# Patient Record
Sex: Female | Born: 1940 | Hispanic: Refuse to answer | Marital: Married | Smoking: Never smoker
Health system: Southern US, Community
[De-identification: ages and names within clinical notes are randomized; demographics above are authoritative.]

## PROBLEM LIST (undated history)

## (undated) ENCOUNTER — Emergency Department (HOSPITAL_COMMUNITY): Payer: Medicare Other

## (undated) DIAGNOSIS — I82409 Acute embolism and thrombosis of unspecified deep veins of unspecified lower extremity: Secondary | ICD-10-CM

## (undated) DIAGNOSIS — I8390 Asymptomatic varicose veins of unspecified lower extremity: Secondary | ICD-10-CM

## (undated) HISTORY — DX: Acute embolism and thrombosis of unspecified deep veins of unspecified lower extremity: I82.409

## (undated) HISTORY — DX: Asymptomatic varicose veins of unspecified lower extremity: I83.90

## (undated) HISTORY — PX: VEIN LIGATION AND STRIPPING: SHX2653

---

## 1970-04-17 HISTORY — PX: TUBAL LIGATION: SHX77

## 2008-07-29 ENCOUNTER — Ambulatory Visit: Payer: Self-pay | Admitting: Vascular Surgery

## 2010-08-30 NOTE — Procedures (Signed)
LOWER EXTREMITY VENOUS REFLUX EXAM   INDICATION:  Bilateral lower extremity varicose veins.  Patient had a  history of right greater saphenous vein stripping in 1980.   EXAM:  Using color-flow imaging and pulse Doppler spectral analysis, the  right and left common femoral, superficial femoral, popliteal, posterior  tibial, greater and lesser saphenous veins are evaluated.  There is  evidence suggesting deep venous insufficiency in the common femoral  veins only bilaterally.   The anterior branch of the left greater saphenous vein is not competent.  The left greater saphenous vein anterior branch is not competent with  the caliber as described below.   The right and left proximal short saphenous veins demonstrate  competency.   GSV Diameter (used if found to be incompetent only)                                            Right    Left  Proximal Greater Saphenous Vein           cm       1.3 cm  Proximal-to-mid-thigh                     cm       0.7 cm  Mid thigh                                 cm       0.7 cm  Mid-distal thigh                          cm       0.8 cm  Distal thigh                              cm       0.6 cm  Knee                                      cm       0.6 cm   IMPRESSION:  1. The left anterior branch of the greater saphenous vein reflux is      identified with the caliber ranging from 1.3 cm to 06 cm knee to      groin.  2. The right and left greater saphenous veins are not aneurysmal.  3. The left greater saphenous vein is tortuous.  4. The deep venous system is mildly incompetent in the common femoral      veins only bilaterally.  5. The right and left lesser saphenous veins are competent.  6. On the right, there appears to be a Baker's cyst behind the right      knee.  There is a superficial vein which originates from an      incompetent right superficial femoral vein perforator at the      proximal thigh level.  An additional  perforator communicates with      this vein at the posterior tibial vein at the ankle.  There is a      large posterior varicose vein which courses up the back of the leg      and  originates from an unknown vein.  7. On the left, the anterior branch of the greater saphenous vein is      incompetent and tortuous from the mid thigh distally.  At the      distal thigh, the anterior branch crosses the leg and continues      laterally down the remainder of the leg.  The medial branch of the      left greater saphenous vein is competent.   ___________________________________________  Larina Earthly, M.D.   MC/MEDQ  D:  07/29/2008  T:  07/29/2008  Job:  [id number]

## 2010-08-30 NOTE — Consult Note (Signed)
NEW PATIENT CONSULTATION   Virginia Roberts, Virginia Roberts  DOB:  [birth year]                                       07/29/2008  [id number]#:[id number]   The patient presents today for evaluation of lower extremity venous  pathology.  She is a very pleasant 70 year old lady who had a long  history of venous pathology.  She had a prior ligation/stripping of her  right great saphenous vein in 1980.  She has had progressive changes in  her left leg of venous pathology.  She denies any bleeding from this.  She does have aching discomfort over the varicosities in her left leg.  She has had a good result from her right leg vein stripping.   PAST MEDICAL HISTORY:  Significant for hypertension.   FAMILY HISTORY:  Significant for severe varicosities in her mother  leading to eventual venous ulcers.   SOCIAL HISTORY:  She is married with two children.  She works as a  Futures trader.  She does not smoke or drink  alcohol.   REVIEW OF SYSTEMS:  CONSTITUTIONAL:  Her weight is reported at 152  pounds.  She is 5 feet 2 inches tall.  GI:  Significant for irritable colon, sometimes diverticulitis.  Did  have a history of thrombophlebitis during delivery.   PHYSICAL EXAM:  Blood pressure is 160/94, heart rate 76.  Her radial  pulses are 2+.  She has 2+ dorsalis pedis pulses bilaterally.  Her lower  extremities are noted for well-healed incisions from her old vein  stripping on the right leg.  She does have a few scattered recurrent  varices over her calf.  On the left leg she has marked varicose veins  extending from her groin over her anterior thigh lateral knee and  lateral calf.  She reports that she does have pain and fullness over  these varicosities.  She does not have any history of recent  thrombophlebitis since childbirth 30 years ago.   She underwent noninvasive vascular laboratory studies in our office and  this reveals a left anterior branch of her saphenous  vein giving rise to  the large varicosities extending over her thigh and distally.  I  discussed treatment options with the patient.  I explained that this is  not a life or limb threatening problem.  I explained that the  indications for treatment of this would be progressive pain.  She  reports that she does have discomfort related to this but currently this  is not disabling to her and she is comfortable with discussion that it  is safe to watch this.  I explained that it is possible that she could  progress to severe venous hypertension and ulceration like her mother  had but this is unlikely given her current level of pathology.  She does  not have any significant skin changes currently.  She was pleased with  this discussion and will see [place] again on an as-needed basis.   Larina Earthly, M.D.  Electronically Signed   TFE/MEDQ  D:  07/29/2008  T:  07/30/2008  Job:  [id number]   cc:   Fredderick Severance

## 2013-11-06 ENCOUNTER — Other Ambulatory Visit: Payer: Self-pay | Admitting: *Deleted

## 2013-11-06 DIAGNOSIS — I83893 Varicose veins of bilateral lower extremities with other complications: Secondary | ICD-10-CM

## 2014-02-10 ENCOUNTER — Encounter: Payer: Self-pay | Admitting: Vascular Surgery

## 2014-02-11 ENCOUNTER — Encounter: Payer: Self-pay | Admitting: Vascular Surgery

## 2014-02-11 ENCOUNTER — Ambulatory Visit (HOSPITAL_COMMUNITY)
Admission: RE | Admit: 2014-02-11 | Discharge: 2014-02-11 | Disposition: A | Discharge status: Home / Self Care | Payer: Medicare Other | Source: Ambulatory Visit | Attending: Vascular Surgery | Admitting: Vascular Surgery

## 2014-02-11 ENCOUNTER — Ambulatory Visit (INDEPENDENT_AMBULATORY_CARE_PROVIDER_SITE_OTHER): Payer: Medicare Other | Admitting: Vascular Surgery

## 2014-02-11 VITALS — BP 143/70 | HR 69 | Resp 16 | Ht 62.75 in | Wt 136.1 lb

## 2014-02-11 DIAGNOSIS — I83899 Varicose veins of unspecified lower extremities with other complications: Secondary | ICD-10-CM | POA: Insufficient documentation

## 2014-02-11 DIAGNOSIS — I83893 Varicose veins of bilateral lower extremities with other complications: Secondary | ICD-10-CM

## 2014-02-11 DIAGNOSIS — I8393 Asymptomatic varicose veins of bilateral lower extremities: Secondary | ICD-10-CM | POA: Diagnosis present

## 2014-02-11 NOTE — Assessment & Plan Note (Signed)
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 There are significant varicosities along the medial aspect of her right thigh and also the anterior lateral aspect of her left thigh and lateral aspect of her left leg. She does have significant deep vein reflux but no saphenous vein reflux on the left and her right greater saphenous vein has previously been stripped. Therefore, I think the only way to address her large varicosities would be surgical excision. She is not a candidate for laser ablation of the left greater saphenous vein since this is competent. We discussed the importance of intermittent leg elevation in the proper positioning for this. I have encouraged her to avoid prolonged sitting and standing. I have encouraged her to stay as active as possible. I offered to write her a prescription for compression stockings how her she states that she already has some. We will see her back as needed. Certainly if her symptoms progressed her she had any complications related to her varicose veins she could be considered for surgical excision.

## 2014-02-11 NOTE — Progress Notes (Signed)
Patient ID: Virginia Roberts, female   DOB: [birth year], 73 y.o.   MRN: [id number]  Reason for Consult: New Evaluation and Varicose Veins   Referred by No ref. provider found  Subjective:     HPI:  Virginia PolesFannie Schwenke is a 73 y.o. female With a long history of varicose veins. She had a DVT in the late 70s after pregnancy in the right lower extremity. She denies any subsequent history of DVT. More recently, in July of this year, she had some phlebitis along the lateral aspect of her left leg. She has significant varicose veins bilaterally and notes aching and heaviness in both lower extremities. Her symptoms are aggravated by standing and sitting. Her symptoms are relieved somewhat with elevation. Her symptoms are somewhat more significant on the left side. She also notes some swelling in both lower extremities which is aggravated by standing. She still works and spends a fair amount of time sitting and standing.  Past Medical History  Diagnosis Date  . DVT (deep venous thrombosis)   . Varicose veins    Family History  Problem Relation Age of Onset  . Cancer Brother   . Hyperlipidemia Brother   . Hypertension Brother   . Heart disease Brother   . Varicose Veins Brother   . Diabetes Daughter   . Hyperlipidemia Daughter   . Diabetes Son   . Hypertension Son   . Hyperlipidemia Son    Past Surgical History  Procedure Laterality Date  . Tubal ligation  1972  . Vein ligation and stripping Right late 1970s    Short Social History:  History  Substance Use Topics  . Smoking status: Never Smoker   . Smokeless tobacco: Not on file  . Alcohol Use: No    Allergies  Allergen Reactions  . Cantil [Mepenzolate]     hives  . Erythromycin     Stomach cramps    Current Outpatient Prescriptions  Medication Sig Dispense Refill  . alendronate (FOSAMAX) 70 MG tablet Take 70 mg by mouth once a week. Take with a full glass of water on an empty stomach.      . ALPRAZolam (XANAX) 1 MG tablet Take  1 mg by mouth at bedtime.      Marland Kitchen. amLODipine (NORVASC) 5 MG tablet Take 5 mg by mouth daily.      Marland Kitchen. oxybutynin (DITROPAN-XL) 10 MG 24 hr tablet Take 10 mg by mouth at bedtime.      . triamterene-hydrochlorothiazide (MAXZIDE) 75-50 MG per tablet Take 1 tablet by mouth daily. Takes 1/2 tablet daily.       No current facility-administered medications for this visit.    Review of Systems  Constitutional: Negative for chills and fever.  Eyes: Negative for loss of vision.  Respiratory: Negative for cough and wheezing.  Cardiovascular: Positive for leg swelling. Negative for chest pain, chest tightness, claudication, dyspnea with exertion, orthopnea and palpitations.  GI: Negative for blood in stool and vomiting.  GU: Negative for dysuria and hematuria.  Musculoskeletal: Negative for leg pain, joint pain and myalgias.  Skin: Negative for rash and wound.  Neurological: Negative for dizziness and speech difficulty.  Hematologic: Negative for bruises/bleeds easily. Psychiatric: Negative for depressed mood.        Objective:  Objective  Filed Vitals:   02/11/14 1254  BP: 143/70  Pulse: 69  Resp: 16  Height: 5' 2.75" (1.594 m)  Weight: 136 lb 1.6 oz (61.735 kg)   Body mass index is 24.3 kg/(m^2).  Physical  Exam  Constitutional: She is oriented to person, place, and time. She appears well-developed and well-nourished.  HENT:  Head: Normocephalic and atraumatic.  Neck: Neck supple. No JVD present. No thyromegaly present.  Cardiovascular: Normal rate, regular rhythm and normal heart sounds.  Exam reveals no friction rub.   No murmur heard. Pulses:      Femoral pulses are 2+ on the right side, and 2+ on the left side.      Posterior tibial pulses are 2+ on the right side, and 2+ on the left side.  I do not detect carotid bruits.  Pulmonary/Chest: Breath sounds normal. She has no wheezes. She has no rales.  Abdominal: Soft. Bowel sounds are normal. There is no tenderness.    Musculoskeletal: Normal range of motion. She exhibits no edema.  Lymphadenopathy:    She has no cervical adenopathy.  Neurological: She is alert and oriented to person, place, and time. She has normal strength. No sensory deficit.  Skin: No lesion and no rash noted.  Psychiatric: She has a normal mood and affect.   Data: I have independently interpreted her venous duplex scan. There is no evidence of DVT bilaterally. She does have reflux in the  Deep veins involving the common femoral vein bilaterally. The right greater saphenous vein has been stripped. The left greater saphenous vein does not have significant reflux although there is some reflux at the saphenofemoral junction which feeds a large tortuous anterior saphenous branch which extends along the anterior lateral aspect of the left thigh and lateral aspect of the left leg.      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 There are significant varicosities along the medial aspect of her right thigh and also the anterior lateral aspect of her left thigh and lateral aspect of her left leg. She does have significant deep vein reflux but no saphenous vein reflux on the left and her right greater saphenous vein has previously been stripped. Therefore, I think the only way to address her large varicosities would be surgical excision. She is not a candidate for laser ablation of the left greater saphenous vein since this is competent. We discussed the importance of intermittent leg elevation in the proper positioning for this. I have encouraged her to avoid prolonged sitting and standing. I have encouraged her to stay as active as possible. I offered to write her a prescription for compression stockings how her she states that she already has some. We will see her back as needed. Certainly if her symptoms progressed her she had any complications related  to her varicose veins she could be considered for surgical excision.    Chuck Hinthristopher S Dickson MD [facility]

## 2014-05-20 ENCOUNTER — Encounter: Payer: Self-pay | Admitting: Vascular Surgery

## 2014-05-20 ENCOUNTER — Encounter (HOSPITAL_COMMUNITY): Payer: Self-pay

## 2021-04-01 ENCOUNTER — Inpatient Hospital Stay (HOSPITAL_COMMUNITY): Payer: Medicare Other

## 2021-04-01 ENCOUNTER — Other Ambulatory Visit: Payer: Self-pay

## 2021-04-01 ENCOUNTER — Inpatient Hospital Stay (HOSPITAL_COMMUNITY)
Admission: EM | Admit: 2021-04-01 | Discharge: 2021-04-08 | DRG: 246 | Disposition: A | Discharge status: Home / Self Care | Payer: Medicare Other | Attending: Family Medicine | Admitting: Family Medicine

## 2021-04-01 ENCOUNTER — Encounter (HOSPITAL_COMMUNITY): Payer: Self-pay | Admitting: Cardiovascular Disease

## 2021-04-01 ENCOUNTER — Inpatient Hospital Stay (HOSPITAL_COMMUNITY)
Admission: EM | Disposition: A | Discharge status: Home / Self Care | Payer: Self-pay | Source: Home / Self Care | Attending: Cardiovascular Disease

## 2021-04-01 ENCOUNTER — Other Ambulatory Visit (HOSPITAL_COMMUNITY): Payer: Self-pay

## 2021-04-01 DIAGNOSIS — E042 Nontoxic multinodular goiter: Secondary | ICD-10-CM | POA: Diagnosis present

## 2021-04-01 DIAGNOSIS — I213 ST elevation (STEMI) myocardial infarction of unspecified site: Secondary | ICD-10-CM | POA: Diagnosis present

## 2021-04-01 DIAGNOSIS — E785 Hyperlipidemia, unspecified: Secondary | ICD-10-CM | POA: Diagnosis present

## 2021-04-01 DIAGNOSIS — N390 Urinary tract infection, site not specified: Secondary | ICD-10-CM | POA: Diagnosis not present

## 2021-04-01 DIAGNOSIS — Z809 Family history of malignant neoplasm, unspecified: Secondary | ICD-10-CM | POA: Diagnosis not present

## 2021-04-01 DIAGNOSIS — B9620 Unspecified Escherichia coli [E. coli] as the cause of diseases classified elsewhere: Secondary | ICD-10-CM | POA: Diagnosis present

## 2021-04-01 DIAGNOSIS — Z83438 Family history of other disorder of lipoprotein metabolism and other lipidemia: Secondary | ICD-10-CM | POA: Diagnosis not present

## 2021-04-01 DIAGNOSIS — I2121 ST elevation (STEMI) myocardial infarction involving left circumflex coronary artery: Secondary | ICD-10-CM

## 2021-04-01 DIAGNOSIS — J810 Acute pulmonary edema: Secondary | ICD-10-CM

## 2021-04-01 DIAGNOSIS — I340 Nonrheumatic mitral (valve) insufficiency: Secondary | ICD-10-CM | POA: Diagnosis not present

## 2021-04-01 DIAGNOSIS — I2119 ST elevation (STEMI) myocardial infarction involving other coronary artery of inferior wall: Principal | ICD-10-CM

## 2021-04-01 DIAGNOSIS — Z86718 Personal history of other venous thrombosis and embolism: Secondary | ICD-10-CM

## 2021-04-01 DIAGNOSIS — I5041 Acute combined systolic (congestive) and diastolic (congestive) heart failure: Secondary | ICD-10-CM | POA: Diagnosis not present

## 2021-04-01 DIAGNOSIS — Z881 Allergy status to other antibiotic agents status: Secondary | ICD-10-CM

## 2021-04-01 DIAGNOSIS — Z781 Physical restraint status: Secondary | ICD-10-CM

## 2021-04-01 DIAGNOSIS — R443 Hallucinations, unspecified: Secondary | ICD-10-CM | POA: Diagnosis not present

## 2021-04-01 DIAGNOSIS — Z20822 Contact with and (suspected) exposure to covid-19: Secondary | ICD-10-CM | POA: Diagnosis present

## 2021-04-01 DIAGNOSIS — Z833 Family history of diabetes mellitus: Secondary | ICD-10-CM

## 2021-04-01 DIAGNOSIS — R079 Chest pain, unspecified: Secondary | ICD-10-CM | POA: Diagnosis present

## 2021-04-01 DIAGNOSIS — I25110 Atherosclerotic heart disease of native coronary artery with unstable angina pectoris: Secondary | ICD-10-CM | POA: Diagnosis present

## 2021-04-01 DIAGNOSIS — Z888 Allergy status to other drugs, medicaments and biological substances status: Secondary | ICD-10-CM

## 2021-04-01 DIAGNOSIS — Z8249 Family history of ischemic heart disease and other diseases of the circulatory system: Secondary | ICD-10-CM

## 2021-04-01 DIAGNOSIS — I700 Atherosclerosis of aorta: Secondary | ICD-10-CM | POA: Diagnosis present

## 2021-04-01 DIAGNOSIS — G9340 Encephalopathy, unspecified: Secondary | ICD-10-CM | POA: Diagnosis not present

## 2021-04-01 DIAGNOSIS — I110 Hypertensive heart disease with heart failure: Secondary | ICD-10-CM | POA: Diagnosis present

## 2021-04-01 DIAGNOSIS — F05 Delirium due to known physiological condition: Secondary | ICD-10-CM | POA: Diagnosis present

## 2021-04-01 DIAGNOSIS — Z7982 Long term (current) use of aspirin: Secondary | ICD-10-CM | POA: Diagnosis not present

## 2021-04-01 DIAGNOSIS — Z955 Presence of coronary angioplasty implant and graft: Secondary | ICD-10-CM

## 2021-04-01 DIAGNOSIS — I200 Unstable angina: Secondary | ICD-10-CM | POA: Diagnosis not present

## 2021-04-01 DIAGNOSIS — I5021 Acute systolic (congestive) heart failure: Secondary | ICD-10-CM | POA: Diagnosis present

## 2021-04-01 DIAGNOSIS — Z79899 Other long term (current) drug therapy: Secondary | ICD-10-CM | POA: Diagnosis not present

## 2021-04-01 DIAGNOSIS — I8390 Asymptomatic varicose veins of unspecified lower extremity: Secondary | ICD-10-CM | POA: Diagnosis present

## 2021-04-01 DIAGNOSIS — I083 Combined rheumatic disorders of mitral, aortic and tricuspid valves: Secondary | ICD-10-CM | POA: Diagnosis present

## 2021-04-01 DIAGNOSIS — I5043 Acute on chronic combined systolic (congestive) and diastolic (congestive) heart failure: Secondary | ICD-10-CM | POA: Diagnosis not present

## 2021-04-01 DIAGNOSIS — I2510 Atherosclerotic heart disease of native coronary artery without angina pectoris: Secondary | ICD-10-CM | POA: Diagnosis not present

## 2021-04-01 DIAGNOSIS — R4182 Altered mental status, unspecified: Secondary | ICD-10-CM

## 2021-04-01 DIAGNOSIS — I2721 Secondary pulmonary arterial hypertension: Secondary | ICD-10-CM | POA: Diagnosis present

## 2021-04-01 DIAGNOSIS — R410 Disorientation, unspecified: Secondary | ICD-10-CM | POA: Diagnosis not present

## 2021-04-01 HISTORY — PX: CORONARY/GRAFT ACUTE MI REVASCULARIZATION: CATH118305

## 2021-04-01 LAB — ECHOCARDIOGRAM COMPLETE
AR max vel: 2.79 cm2
AV Area VTI: 3.05 cm2
AV Area mean vel: 2.73 cm2
AV Mean grad: 3 mmHg
AV Peak grad: 6.2 mmHg
Ao pk vel: 1.24 m/s
Height: 60 in
MV M vel: 5.12 m/s
MV Peak grad: 104.9 mmHg
P 1/2 time: 322 msec
Radius: 0.7 cm
S' Lateral: 3.7 cm
Weight: 1840 oz

## 2021-04-01 LAB — COMPREHENSIVE METABOLIC PANEL
ALT: 19 U/L (ref 0–44)
AST: 34 U/L (ref 15–41)
Albumin: 3.7 g/dL (ref 3.5–5.0)
Alkaline Phosphatase: 50 U/L (ref 38–126)
Anion gap: 8 (ref 5–15)
BUN: 14 mg/dL (ref 8–23)
CO2: 25 mmol/L (ref 22–32)
Calcium: 8.7 mg/dL — ABNORMAL LOW (ref 8.9–10.3)
Chloride: 104 mmol/L (ref 98–111)
Creatinine, Ser: 0.81 mg/dL (ref 0.44–1.00)
GFR, Estimated: >60 mL/min (ref >60)
Glucose, Bld: 132 mg/dL — ABNORMAL HIGH (ref 70–99)
Potassium: 3.2 mmol/L — ABNORMAL LOW (ref 3.5–5.1)
Sodium: 137 mmol/L (ref 135–145)
Total Bilirubin: 1.2 mg/dL (ref 0.3–1.2)
Total Protein: 5.7 g/dL — ABNORMAL LOW (ref 6.5–8.1)

## 2021-04-01 LAB — TROPONIN I (HIGH SENSITIVITY)
Troponin I (High Sensitivity): 95 ng/L — ABNORMAL HIGH (ref <18)
Troponin I (High Sensitivity): 19301 ng/L (ref <18)

## 2021-04-01 LAB — LIPID PANEL
Cholesterol: 157 mg/dL (ref 0–200)
HDL: 68 mg/dL (ref >40)
LDL Cholesterol: 82 mg/dL (ref 0–99)
Total CHOL/HDL Ratio: 2.3 RATIO
Triglycerides: 36 mg/dL (ref <150)
VLDL: 7 mg/dL (ref 0–40)

## 2021-04-01 LAB — CBC
HCT: 37 % (ref 36.0–46.0)
Hemoglobin: 12.1 g/dL (ref 12.0–15.0)
MCH: 32 pg (ref 26.0–34.0)
MCHC: 32.7 g/dL (ref 30.0–36.0)
MCV: 97.9 fL (ref 80.0–100.0)
Platelets: 173 10*3/uL (ref 150–400)
RBC: 3.78 MIL/uL — ABNORMAL LOW (ref 3.87–5.11)
RDW: 12.8 % (ref 11.5–15.5)
WBC: 6.8 10*3/uL (ref 4.0–10.5)
nRBC: 0 % (ref 0.0–0.2)

## 2021-04-01 LAB — POCT ACTIVATED CLOTTING TIME
Activated Clotting Time: 179 seconds
Activated Clotting Time: 233 seconds
Activated Clotting Time: 281 seconds

## 2021-04-01 LAB — PROTIME-INR
INR: 1.2 (ref 0.8–1.2)
Prothrombin Time: 14.9 seconds (ref 11.4–15.2)

## 2021-04-01 LAB — BRAIN NATRIURETIC PEPTIDE: B Natriuretic Peptide: 982.3 pg/mL — ABNORMAL HIGH (ref 0.0–100.0)

## 2021-04-01 LAB — T4, FREE: Free T4: 1.44 ng/dL — ABNORMAL HIGH (ref 0.61–1.12)

## 2021-04-01 LAB — RESP PANEL BY RT-PCR (FLU A&B, COVID) ARPGX2
Influenza A by PCR: NEGATIVE
Influenza B by PCR: NEGATIVE
SARS Coronavirus 2 by RT PCR: NEGATIVE

## 2021-04-01 LAB — MAGNESIUM: Magnesium: 1.7 mg/dL (ref 1.7–2.4)

## 2021-04-01 LAB — HEMOGLOBIN A1C
Hgb A1c MFr Bld: 6 % — ABNORMAL HIGH (ref 4.8–5.6)
Hgb A1c MFr Bld: 6.1 % — ABNORMAL HIGH (ref 4.8–5.6)
Mean Plasma Glucose: 125.5 mg/dL
Mean Plasma Glucose: 128.37 mg/dL

## 2021-04-01 LAB — TSH: TSH: 1.384 u[IU]/mL (ref 0.350–4.500)

## 2021-04-01 LAB — MRSA NEXT GEN BY PCR, NASAL: MRSA by PCR Next Gen: NOT DETECTED

## 2021-04-01 SURGERY — CORONARY/GRAFT ACUTE MI REVASCULARIZATION
Anesthesia: LOCAL

## 2021-04-01 MED ORDER — MIDAZOLAM HCL 2 MG/2ML IJ SOLN
INTRAMUSCULAR | Status: AC
  Filled 2021-04-01: qty 2

## 2021-04-01 MED ORDER — NITROGLYCERIN 1 MG/10 ML FOR IR/CATH LAB
INTRA_ARTERIAL | Status: AC
  Filled 2021-04-01: qty 10

## 2021-04-01 MED ORDER — ONDANSETRON HCL 4 MG/2ML IJ SOLN
4.0000 mg | Freq: Four times a day (QID) | INTRAMUSCULAR | Status: DC | PRN
  Administered 2021-04-01 – 2021-04-02 (×4): 4 mg via INTRAVENOUS
  Filled 2021-04-01 (×4): qty 2

## 2021-04-01 MED ORDER — TIROFIBAN HCL IN NACL 5-0.9 MG/100ML-% IV SOLN
INTRAVENOUS | Status: AC
  Filled 2021-04-01: qty 100

## 2021-04-01 MED ORDER — SODIUM CHLORIDE (PF) 0.9 % IJ SOLN
INTRAMUSCULAR | Status: DC | PRN
  Administered 2021-04-01: 10 mL via INTRAVENOUS

## 2021-04-01 MED ORDER — TICAGRELOR 90 MG PO TABS
90.0000 mg | ORAL_TABLET | Freq: Two times a day (BID) | ORAL | Status: DC
  Administered 2021-04-01 – 2021-04-08 (×14): 90 mg via ORAL
  Filled 2021-04-01 (×14): qty 1

## 2021-04-01 MED ORDER — SODIUM CHLORIDE 0.9 % WEIGHT BASED INFUSION
1.0000 mL/kg/h | INTRAVENOUS | Status: AC
  Administered 2021-04-01: 1 mL/kg/h via INTRAVENOUS

## 2021-04-01 MED ORDER — SODIUM CHLORIDE 0.9% FLUSH
3.0000 mL | Freq: Two times a day (BID) | INTRAVENOUS | Status: DC
  Administered 2021-04-02 – 2021-04-08 (×8): 3 mL via INTRAVENOUS

## 2021-04-01 MED ORDER — SODIUM CHLORIDE 0.9 % IV SOLN: 250.0000 mL | INTRAVENOUS | Status: DC | PRN

## 2021-04-01 MED ORDER — HEPARIN SODIUM (PORCINE) 1000 UNIT/ML IJ SOLN
INTRAMUSCULAR | Status: DC | PRN
  Administered 2021-04-01: 4000 [IU] via INTRAVENOUS
  Administered 2021-04-01: 3000 [IU] via INTRAVENOUS

## 2021-04-01 MED ORDER — LABETALOL HCL 5 MG/ML IV SOLN: 10.0000 mg | INTRAVENOUS | Status: AC | PRN

## 2021-04-01 MED ORDER — FENTANYL CITRATE (PF) 100 MCG/2ML IJ SOLN
INTRAMUSCULAR | Status: AC
  Filled 2021-04-01: qty 2

## 2021-04-01 MED ORDER — NITROGLYCERIN 1 MG/10 ML FOR IR/CATH LAB
INTRA_ARTERIAL | Status: DC | PRN
  Administered 2021-04-01: 100 ug via INTRACORONARY

## 2021-04-01 MED ORDER — ASPIRIN 81 MG PO CHEW: 324.0000 mg | CHEWABLE_TABLET | ORAL | Status: DC

## 2021-04-01 MED ORDER — TIROFIBAN (AGGRASTAT) BOLUS VIA INFUSION
INTRAVENOUS | Status: DC | PRN
  Administered 2021-04-01: 1305 ug via INTRAVENOUS

## 2021-04-01 MED ORDER — POTASSIUM CHLORIDE CRYS ER 20 MEQ PO TBCR
40.0000 meq | EXTENDED_RELEASE_TABLET | Freq: Once | ORAL | Status: AC
  Administered 2021-04-02: 40 meq via ORAL
  Filled 2021-04-01: qty 2

## 2021-04-01 MED ORDER — POTASSIUM CHLORIDE CRYS ER 20 MEQ PO TBCR
40.0000 meq | EXTENDED_RELEASE_TABLET | Freq: Once | ORAL | Status: AC
Start: 2021-04-01 — End: 2021-04-01
  Administered 2021-04-01: 40 meq via ORAL
  Filled 2021-04-01: qty 2

## 2021-04-01 MED ORDER — SODIUM CHLORIDE 0.9% FLUSH: 3.0000 mL | INTRAVENOUS | Status: DC | PRN

## 2021-04-01 MED ORDER — HYDRALAZINE HCL 20 MG/ML IJ SOLN: 10.0000 mg | INTRAMUSCULAR | Status: AC | PRN

## 2021-04-01 MED ORDER — LIDOCAINE HCL (PF) 1 % IJ SOLN
INTRAMUSCULAR | Status: AC
  Filled 2021-04-01: qty 30

## 2021-04-01 MED ORDER — VERAPAMIL HCL 2.5 MG/ML IV SOLN
INTRAVENOUS | Status: DC | PRN
  Administered 2021-04-01: 10 mL via INTRA_ARTERIAL

## 2021-04-01 MED ORDER — FENTANYL CITRATE (PF) 100 MCG/2ML IJ SOLN
INTRAMUSCULAR | Status: DC | PRN
  Administered 2021-04-01: 25 ug via INTRAVENOUS

## 2021-04-01 MED ORDER — NITROGLYCERIN 0.4 MG SL SUBL: 0.4000 mg | SUBLINGUAL_TABLET | SUBLINGUAL | Status: DC | PRN

## 2021-04-01 MED ORDER — ASPIRIN 300 MG RE SUPP: 300.0000 mg | RECTAL | Status: DC

## 2021-04-01 MED ORDER — ALPRAZOLAM 0.5 MG PO TABS
1.0000 mg | ORAL_TABLET | Freq: Every day | ORAL | Status: DC
  Administered 2021-04-01: 1 mg via ORAL
  Filled 2021-04-01: qty 2

## 2021-04-01 MED ORDER — SODIUM CHLORIDE 0.9 % IV SOLN: INTRAVENOUS | Status: DC

## 2021-04-01 MED ORDER — HEPARIN (PORCINE) IN NACL 2000-0.9 UNIT/L-% IV SOLN
INTRAVENOUS | Status: DC | PRN
  Administered 2021-04-01 (×2): 1000 mL

## 2021-04-01 MED ORDER — ASPIRIN EC 81 MG PO TBEC
81.0000 mg | DELAYED_RELEASE_TABLET | Freq: Every day | ORAL | Status: DC
  Administered 2021-04-02 – 2021-04-08 (×6): 81 mg via ORAL
  Filled 2021-04-01 (×6): qty 1

## 2021-04-01 MED ORDER — POTASSIUM CHLORIDE CRYS ER 20 MEQ PO TBCR
40.0000 meq | EXTENDED_RELEASE_TABLET | Freq: Once | ORAL | Status: AC
  Administered 2021-04-01: 40 meq via ORAL
  Filled 2021-04-01: qty 2

## 2021-04-01 MED ORDER — ACETAMINOPHEN 325 MG PO TABS: 650.0000 mg | ORAL_TABLET | ORAL | Status: DC | PRN

## 2021-04-01 MED ORDER — MIDAZOLAM HCL 2 MG/2ML IJ SOLN
INTRAMUSCULAR | Status: DC | PRN
  Administered 2021-04-01: 1 mg via INTRAVENOUS

## 2021-04-01 MED ORDER — ONDANSETRON HCL 4 MG/2ML IJ SOLN
INTRAMUSCULAR | Status: AC
  Filled 2021-04-01: qty 2

## 2021-04-01 MED ORDER — HEPARIN (PORCINE) IN NACL 1000-0.9 UT/500ML-% IV SOLN
INTRAVENOUS | Status: AC
  Filled 2021-04-01: qty 1000

## 2021-04-01 MED ORDER — LIDOCAINE HCL (PF) 1 % IJ SOLN
INTRAMUSCULAR | Status: DC | PRN
  Administered 2021-04-01: 2 mL

## 2021-04-01 MED ORDER — ATORVASTATIN CALCIUM 80 MG PO TABS
80.0000 mg | ORAL_TABLET | Freq: Every day | ORAL | Status: DC
  Administered 2021-04-02 – 2021-04-08 (×7): 80 mg via ORAL
  Filled 2021-04-01 (×7): qty 1

## 2021-04-01 MED ORDER — MAGNESIUM SULFATE 2 GM/50ML IV SOLN
2.0000 g | Freq: Once | INTRAVENOUS | Status: AC
  Administered 2021-04-01: 2 g via INTRAVENOUS
  Filled 2021-04-01: qty 50

## 2021-04-01 MED ORDER — METOPROLOL TARTRATE 12.5 MG HALF TABLET
12.5000 mg | ORAL_TABLET | Freq: Two times a day (BID) | ORAL | Status: DC
  Administered 2021-04-01 – 2021-04-04 (×7): 12.5 mg via ORAL
  Filled 2021-04-01 (×7): qty 1

## 2021-04-01 MED ORDER — FUROSEMIDE 10 MG/ML IJ SOLN: 40.0000 mg | Freq: Two times a day (BID) | INTRAMUSCULAR | Status: DC

## 2021-04-01 MED ORDER — OXYBUTYNIN CHLORIDE ER 10 MG PO TB24
10.0000 mg | ORAL_TABLET | Freq: Every day | ORAL | Status: DC
  Administered 2021-04-01 – 2021-04-08 (×7): 10 mg via ORAL
  Filled 2021-04-01 (×10): qty 1

## 2021-04-01 MED ORDER — ACETAMINOPHEN 325 MG PO TABS
650.0000 mg | ORAL_TABLET | ORAL | Status: DC | PRN
  Administered 2021-04-02: 22:00:00 650 mg via ORAL
  Filled 2021-04-01: qty 2

## 2021-04-01 MED ORDER — FUROSEMIDE 10 MG/ML IJ SOLN
40.0000 mg | Freq: Two times a day (BID) | INTRAMUSCULAR | Status: DC
  Administered 2021-04-01 – 2021-04-03 (×4): 40 mg via INTRAVENOUS
  Filled 2021-04-01 (×4): qty 4

## 2021-04-01 MED ORDER — IOHEXOL 350 MG/ML SOLN
INTRAVENOUS | Status: DC | PRN
  Administered 2021-04-01: 140 mL

## 2021-04-01 SURGICAL SUPPLY — 23 items
BALLN SAPPHIRE 2.0X15 (BALLOONS) ×3
BALLN ~~LOC~~ EUPHORA RX 3.25X12 (BALLOONS) ×3
BALLOON SAPPHIRE 2.0X15 (BALLOONS) IMPLANT
BALLOON ~~LOC~~ EUPHORA RX 3.25X12 (BALLOONS) IMPLANT
CATH 5FR JL3.5 JR4 ANG PIG MP (CATHETERS) ×2 IMPLANT
CATH LAUNCHER 6FR EBU3.5 (CATHETERS) ×2 IMPLANT
DEVICE RAD TR BAND REGULAR (VASCULAR PRODUCTS) ×2 IMPLANT
GLIDESHEATH SLEND SS 6F .021 (SHEATH) ×2 IMPLANT
GUIDEWIRE ANGLED .035X260CM (WIRE) ×2 IMPLANT
GUIDEWIRE INQWIRE 1.5J.035X260 (WIRE) IMPLANT
INQWIRE 1.5J .035X260CM (WIRE) ×3
KIT ENCORE 26 ADVANTAGE (KITS) ×2 IMPLANT
KIT HEART LEFT (KITS) ×3 IMPLANT
KIT HEMO VALVE WATCHDOG (MISCELLANEOUS) ×2 IMPLANT
PACK CARDIAC CATHETERIZATION (CUSTOM PROCEDURE TRAY) ×3 IMPLANT
SHEATH 6FR 85 DEST SLENDER (SHEATH) ×2 IMPLANT
SHEATH PROBE COVER 6X72 (BAG) ×2 IMPLANT
STENT ONYX FRONTIER 3.0X26 (Permanent Stent) ×2 IMPLANT
TRANSDUCER W/STOPCOCK (MISCELLANEOUS) ×3 IMPLANT
TUBING CIL FLEX 10 FLL-RA (TUBING) ×3 IMPLANT
WIRE COUGAR XT STRL 190CM (WIRE) ×2 IMPLANT
WIRE HI TORQ VERSACORE-J 145CM (WIRE) ×2 IMPLANT
WIRE HI TORQ WHISPER MS 190CM (WIRE) ×2 IMPLANT

## 2021-04-01 NOTE — Progress Notes (Signed)
°  Echocardiogram 2D Echocardiogram has been performed.  Gerda Diss 04/01/2021, 6:10 PM

## 2021-04-01 NOTE — Progress Notes (Signed)
Progress Note  Patient Name: Virginia Roberts Date of Encounter: 04/02/2021  [facility] Cardiologist: None   Subjective   Cath with 75% mid-RCA, 100% mid-Lcx, 100% Om2, 40% prox-mid LAD. Underwent successful PCI of Lcx with distal embolization to OM sub-branch. Planned for RCA intervention on Monday  Very agitated overnight and pulling at lines. Placed in soft restraints and initiated on precedex. Developed right radial hematoma in this setting.  More calm this morning on precedex. Intermittently answering questions. Right radial hematoma is soft with palpable radial pulse  Inpatient Medications    Scheduled Meds:  ALPRAZolam  1 mg Oral QHS   aspirin EC  81 mg Oral Daily   atorvastatin  80 mg Oral Daily   Chlorhexidine Gluconate Cloth  6 each Topical Daily   furosemide  40 mg Intravenous BID   metoprolol tartrate  12.5 mg Oral BID   oxybutynin  10 mg Oral QHS   sodium chloride flush  3 mL Intravenous Q12H   ticagrelor  90 mg Oral BID   Continuous Infusions:  sodium chloride     sodium chloride     dexmedetomidine (PRECEDEX) IV infusion 0.4 mcg/kg/hr (04/02/21 0700)   PRN Meds: sodium chloride, acetaminophen, nitroGLYCERIN, ondansetron (ZOFRAN) IV, sodium chloride flush   Vital Signs    Vitals:   04/02/21 0700 04/02/21 0744 04/02/21 0800 04/02/21 0900  BP: 111/69 104/75 97/63 (!) 87/51  Pulse: 62 64 62 (!) 57  Resp: 18 18 18 17   Temp:      TempSrc:      SpO2: 94% 96% 97% 98%  Weight:      Height:        Intake/Output Summary (Last 24 hours) at 04/02/2021 0914 Last data filed at 04/02/2021 0700 Gross per 24 hour  Intake 347.53 ml  Output 4455 ml  Net -4107.47 ml   Last 3 Weights 04/01/2021 02/11/2014  Weight (lbs) 115 lb 136 lb 1.6 oz  Weight (kg) 52.164 kg 61.735 kg      Telemetry    NSR, frequent PVCs, brief NSVT, bigeminy - Personally Reviewed  ECG    NSR, t-wave flattening inferolaterally - Personally Reviewed  Physical Exam   GEN:  Elderly female, calm, mildly sedated  Neck: No JVD Cardiac: RR, 2/6 systolic murmur Respiratory: Crackles at bases GI: Soft, nontender, non-distended  MS: Warm, trace edema. Right radial site with large hematoma that is soft. Palpable radial pulse Neuro:  Sedated but answers questions appropriately Psych: Agitated  Labs    High Sensitivity Troponin:   Recent Labs  Lab 04/01/21 1342 04/01/21 1650  TROPONINIHS 95* 19,301*     Chemistry Recent Labs  Lab 04/01/21 1342 04/02/21 0053 04/02/21 0243  NA 137 138 137  K 3.2* 3.5 3.5  CL 104  --  100  CO2 25  --  26  GLUCOSE 132*  --  158*  BUN 14  --  12  CREATININE 0.81  --  0.94  CALCIUM 8.7*  --  8.9  MG 1.7  --  2.3  PROT 5.7*  --   --   ALBUMIN 3.7  --   --   AST 34  --   --   ALT 19  --   --   ALKPHOS 50  --   --   BILITOT 1.2  --   --   GFRNONAA >60  --  >60  ANIONGAP 8  --  11    Lipids  Recent Labs  Lab 04/02/21 0243  CHOL 188  TRIG 107  HDL 75  LDLCALC 92  CHOLHDL 2.5    Hematology Recent Labs  Lab 04/01/21 1342 04/02/21 0053 04/02/21 0243  WBC 6.8  --  8.6  RBC 3.78*  --  4.33  HGB 12.1 14.3 14.3  HCT 37.0 42.0 42.2  MCV 97.9  --  97.5  MCH 32.0  --  33.0  MCHC 32.7  --  33.9  RDW 12.8  --  12.9  PLT 173  --  198   Thyroid  Recent Labs  Lab 04/01/21 1650  TSH 1.384  FREET4 1.44*    BNP Recent Labs  Lab 04/01/21 1351  BNP 982.3*    DDimer No results for input(s): DDIMER in the last 168 hours.   Radiology    CARDIAC CATHETERIZATION  Result Date: 04/01/2021   Mid RCA lesion is 75% stenosed.   Mid Cx lesion is 100% stenosed.   2nd Mrg lesion is 100% stenosed.   Prox LAD to Mid LAD lesion is 40% stenosed.   A drug-eluting stent was successfully placed using a STENT ONYX FRONTIER 3.0X26.   Post intervention, there is a 0% residual stenosis. Acute inferoposterior STEMI secondary to occlusion of the mid circumflex, treated successfully with primary PCI using a 3.0x26 mm Onyx Frontier  DES. Residual downstream embolus/thrombus results in occluded OM sub-branch Severe mid-RCA stenosis of 75% Nonobstructive left main and LAD plaquing without severe stenoses Moderately elevated LVEDP Plan: post-MI medical therapy, check 2D echo, recommend staged PCI of the RCA Monday if no complications. **for future cath do NOT use right radial artery**   DG CHEST PORT 1 VIEW  Result Date: 04/01/2021 CLINICAL DATA:  Edema EXAM: PORTABLE CHEST 1 VIEW COMPARISON:  Report 10/07/2011 FINDINGS: Cardiomegaly with vascular congestion. Diffuse bilateral interstitial and ground-glass opacity, suspicious for pulmonary edema. Probable tiny pleural effusions. Asymmetric right apical opacity. IMPRESSION: 1. Cardiomegaly with vascular congestion and diffuse bilateral interstitial and ground-glass opacity, suspect for pulmonary edema. Trace pleural effusion 2. Asymmetrical masslike right apical opacity, concerning for lung mass. Recommend chest CT for further evaluation These results will be called to the ordering clinician or representative by the Radiologist Assistant, and communication documented in the PACS or [employer]. Electronically Signed   By: Virginia Roberts M.D.   On: 04/01/2021 20:36   ECHOCARDIOGRAM COMPLETE  Result Date: 04/01/2021    ECHOCARDIOGRAM REPORT   Patient Name:   Virginia Roberts Date of Exam: 04/01/2021 Medical Rec #:  [id number]      Height:       60.0 in Accession #:    [id number]     Weight:       115.0 lb Date of Birth:  [birth year]      BSA:          1.475 m Patient Age:    80 years       BP:           99/88 mmHg Patient Gender: F              HR:           81 bpm. Exam Location:  Inpatient Procedure: 2D Echo, Cardiac Doppler and Color Doppler Indications:    Chest pain  History:        Patient has no prior history of Echocardiogram examinations.                 Acute MI; Risk Factors:Hypertension. Hx DVT. S/P PCI.  Sonographer:  Ross Ludwig RDCS (AE) Referring Phys: [phone] Virginia Roberts  IMPRESSIONS  1. Left ventricular ejection fraction, by estimation, is 45 to 50%. The left ventricle has mildly decreased function. The left ventricle demonstrates global hypokinesis. There is mild left ventricular hypertrophy of the basal segment.  2. Right ventricular systolic function is normal. The right ventricular size is moderately enlarged. There is severely elevated pulmonary artery systolic pressure.  3. Left atrial size was severely dilated.  4. Right atrial size was severely dilated.  5. Severe mitral valve regurgitation.  6. Tricuspid valve regurgitation is severe.  7. Aortic valve regurgitation is moderate to severe.  8. Pulmonic valve regurgitation is moderate to severe. FINDINGS  Left Ventricle: Left ventricular ejection fraction, by estimation, is 45 to 50%. The left ventricle has mildly decreased function. The left ventricle demonstrates global hypokinesis. The left ventricular internal cavity size was normal in size. There is  mild left ventricular hypertrophy of the basal segment. Right Ventricle: The right ventricular size is moderately enlarged. No increase in right ventricular wall thickness. Right ventricular systolic function is normal. There is severely elevated pulmonary artery systolic pressure. The tricuspid regurgitant velocity is 3.58 m/s, and with an assumed right atrial pressure of 15 mmHg, the estimated right ventricular systolic pressure is 66.3 mmHg. Left Atrium: Left atrial size was severely dilated. Right Atrium: Right atrial size was severely dilated. Pericardium: There is no evidence of pericardial effusion. Mitral Valve: There is moderate calcification of the mitral valve leaflet(s). Severe mitral valve regurgitation. 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 Aortic Valve: Aortic valve regurgitation is moderate to severe. Aortic regurgitation PHT measures 322 msec. Aortic valve mean gradient measures 3.0 mmHg. Aortic valve peak gradient measures 6.2 mmHg.  Aortic valve area, by VTI measures 3.05 cm. Pulmonic Valve: Pulmonic valve regurgitation is moderate to severe.  LEFT VENTRICLE PLAX 2D LVIDd:         4.80 cm LVIDs:         3.70 cm LV PW:         1.10 cm LV IVS:        1.20 cm LVOT diam:     2.00 cm LV SV:         77 LV SV Index:   52 LVOT Area:     3.14 cm  RIGHT VENTRICLE             IVC RV Basal diam:  4.70 cm     IVC diam: 2.50 cm RV Mid diam:    3.40 cm RV S prime:     15.30 cm/s TAPSE (M-mode): 2.5 cm LEFT ATRIUM             Index        RIGHT ATRIUM           Index LA diam:        4.20 cm 2.85 cm/m   RA Area:     15.80 cm LA Vol (A2C):   74.9 ml 50.77 ml/m  RA Volume:   41.00 ml  27.79 ml/m LA Vol (A4C):   77.7 ml 52.66 ml/m LA Biplane Vol: 84.1 ml 57.00 ml/m  AORTIC VALVE AV Area (Vmax):    2.79 cm AV Area (Vmean):   2.73 cm AV Area (VTI):     3.05 cm AV Vmax:           124.00 cm/s AV Vmean:          86.500 cm/s AV VTI:  0.252 m AV Peak Grad:      6.2 mmHg AV Mean Grad:      3.0 mmHg LVOT Vmax:         110.00 cm/s LVOT Vmean:        75.100 cm/s LVOT VTI:          0.245 m LVOT/AV VTI ratio: 0.97 AI PHT:            322 msec  AORTA Ao Root diam: 3.60 cm Ao Asc diam:  3.50 cm MR Peak grad:    104.9 mmHg   TRICUSPID VALVE MR Mean grad:    68.0 mmHg    TR Peak grad:   51.3 mmHg MR Vmax:         512.00 cm/s  TR Vmax:        358.00 cm/s MR Vmean:        384.0 cm/s MR PISA:         3.08 cm     SHUNTS MR PISA Eff ROA: 19 mm       Systemic VTI:  0.24 m MR PISA Radius:  0.70 cm      Systemic Diam: 2.00 cm Carolan Clines Electronically signed by Carolan Clines Signature Date/Time: 04/01/2021/7:02:41 PM    Final     Cardiac Studies   TTE 04/01/21: IMPRESSIONS   1. Left ventricular ejection fraction, by estimation, is 45 to 50%. The  left ventricle has mildly decreased function. The left ventricle  demonstrates global hypokinesis. There is mild left ventricular  hypertrophy of the basal segment.   2. Right ventricular systolic function is  normal. The right ventricular  size is moderately enlarged. There is severely elevated pulmonary artery  systolic pressure.   3. Left atrial size was severely dilated.   4. Right atrial size was severely dilated.   5. Severe mitral valve regurgitation.   6. Tricuspid valve regurgitation is severe.   7. Aortic valve regurgitation is moderate to severe.   8. Pulmonic valve regurgitation is moderate to severe.   LHC 04/01/21:   Mid RCA lesion is 75% stenosed.   Mid Cx lesion is 100% stenosed.   2nd Mrg lesion is 100% stenosed.   Prox LAD to Mid LAD lesion is 40% stenosed.   A drug-eluting stent was successfully placed using a STENT ONYX FRONTIER 3.0X26.   Post intervention, there is a 0% residual stenosis.   Acute inferoposterior STEMI secondary to occlusion of the mid circumflex, treated successfully with primary PCI using a 3.0x26 mm Onyx Frontier DES. 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    **for future cath do NOT use right radial artery**  Patient Profile     80 y.o. female HTN and remote history of DVT who presented with chest pain found to have STE in inferior leads consistent with STEMI. Cath with 100% mid-Lcx and Om2 s/p PCI to the mid-Lcx with distal embolization to OM sub-branch. Also with 75% RCA now planned for staged PCI on Monday.  Assessment & Plan    #STEMI: #Multivessel CAD: Patient presented with chest pain found to have inferior STE with reciprocal changes consistent with STEMI. Cath with 75% mid-RCA, 100% mid-Lcx, 100% Om2, 40% prox-mid LAD. Underwent successful PCI of Lcx with distal embolization to OM sub-branch. Planned  for RCA intervention on Monday.  -S/p successful PCI to mid-Lcx as above -Plan  for staged RCA intervention on Monday -NO RIGHT RADIAL ACCESS -Continue ASA 81mg  daily, brilinta 90mg  BID -Continue lipitor 80mg  daily -Continue metop 12.5mg  BID; transition to long-acting prior to discharge -Will add ARB/ACE post-cath as able pending renal function  #Acute Systolic HF: Newly diagnosed in the setting of STEMI. LVEF 45-50% on TTE with global hypokinesis. LVEDP on cath .  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 Suspect MR and TR will improve with diuresis, however PASP severely elevated and LA severely dilated which may suggest patient has long-standing severe MR. Will need repeat TTE as out-patient once more clinically euvolemic and if persistently severe MR, may need surgery vs clip.  #Severe Pulmonary HTN: In the setting of severe MR as detailed above. Will reassess following diuresis and may need MVR in the future.  -Diuresis as above -Will need repeat TTE for monitoring as out-patient and if persistent severe MR with pulmonary HTN and reduced EF, may merit MVR if deemed a candidate  #Agitation: #Delirium: Very agitated overnight and ripping at lines. Was placed on precedex with improvement. Will order soft belt restraint and continue attempts for frequent re-orientation. -Continue precedex; wean as able -Frequent re-orientation -Will order soft restraints to protect the patient  #Right radial hematoma: Developed post-cath in the setting of significant agitation. Hematoma soft. Radial pulse palpable. HgB stable -Continue conservative management -Trend hemoglobin  CRITICAL CARE TIME: I have spent a total of 35 minutes with patient reviewing  hospital notes, telemetry, EKGs, labs and examining the patient as well as establishing an assessment and plan that was discussed with the patient.  > 50% of time was spent in direct patient care. The patient is critically ill with multi-organ system failure and requires high complexity decision making for assessment and support, frequent evaluation and titration of therapies, application of advanced monitoring technologies and extensive interpretation of multiple databases.   For questions or updates, please contact [facility] Please consult [email or web address] for contact info under        Signed, , MD  04/02/2021, 9:14 AM

## 2021-04-01 NOTE — Progress Notes (Addendum)
See MAR for meds given for N/V, approximately 100cc of yellowish/green emesis noted, washcloth given, pt states she is feeling better after med given, Purewick placed, tolerated well, safety maintained

## 2021-04-01 NOTE — Progress Notes (Signed)
RN reporting that the patient has been SOB and now on 10lts oxygen  Upon reviewing chart- she presented with inferop-posterior STEMI s/p PCI to [facility] c/b residual downstream embolus in occluded OM sub branch , has RCA dx 75% lesion (plans for PCI on Monday).  She has been hypoxic during the day.  On exam: JVD+, diffuse lung crackles, she is tachypneic, using accessory muscles for respiration. No Leg edema, legs are warm BP is stable Labs: K is low at 3.2, mag is low as well   Acute pulmonary edema, Acute decompensated HFrEF Acute hypoxic respiratory failure sec to above  -> replaced K and Mag -> Scheduled IV lasix 40mg  bid for 2 days. Reassess daily  -> Urinary incontinence, will temporarily put Foley catheter for 1 day and please remove it on Sunday to prevent CAUTI -> CXR ordered- shows Pulmonary edema and suspicious right UL lung mass-> Needs CT Chest for further review when stable from acute pulmonary edema.  Wednesday, MD Cardiology coverage.

## 2021-04-01 NOTE — H&P (Addendum)
Cardiology Admission History and Physical:   Patient ID: Virginia Roberts MRN: [id number]; DOB: 1940/10/25   Admission date: 04/01/2021  PCP:  Leone Haven, MD   [facility] Providers Cardiologist:  None        Chief Complaint:  chest pain   Patient Profile:   Verneil Arrell is a 80 y.o. female with HTN and prn lasix presented to [facility] with STEMI  who is being seen 04/01/2021 .  History of Present Illness:   Ms. Riches with above hx and has been caring for her husband due to his illness and pushing herself developed chest pain today around 0730 after being up.  She has had chest discomfort other times but never this bad.  No hx of CAD and has not seen cardiologist.no hx of CVA, no tobacco and no GI bleeding.   She had ST elevation inf leads with reciprocal changes ant leads.  She was given IV heparin and 90 mg Brilinta, she had taken ecotrin 2 tabs at home.  Currently pain fairly resolved. Discussed cath with her.     Past Medical History:  Diagnosis Date   DVT (deep venous thrombosis) (HCC)    Varicose veins     Past Surgical History:  Procedure Laterality Date   TUBAL LIGATION  1972   VEIN LIGATION AND STRIPPING Right late 1970s     Medications Prior to Admission: Prior to Admission medications   Medication Sig Start Date End Date Taking? Authorizing Provider  alendronate (FOSAMAX) 70 MG tablet Take 70 mg by mouth once a week. Take with a full glass of water on an empty stomach.    [provider]  ALPRAZolam Duanne Moron) 1 MG tablet Take 1 mg by mouth at bedtime.    [provider]  amLODipine (NORVASC) 5 MG tablet Take 5 mg by mouth daily.    [provider]  oxybutynin (DITROPAN-XL) 10 MG 24 hr tablet Take 10 mg by mouth at bedtime.    [provider]  triamterene-hydrochlorothiazide (MAXZIDE) 75-50 MG per tablet Take 1 tablet by mouth daily. Takes 1/2 tablet daily.    [provider]    She is on BB and prn  furosemide, she rarely takes furosemide.  Allergies:    Allergies  Allergen Reactions   Cantil [Mepenzolate]     hives   Erythromycin     Stomach cramps    Social History:   Social History   Socioeconomic History   Marital status: Married    Spouse name: Not on file   Number of children: Not on file   Years of education: Not on file   Highest education level: Not on file  Occupational History   Not on file  Tobacco Use   Smoking status: Never   Smokeless tobacco: Not on file  Substance and Sexual Activity   Alcohol use: No   Drug use: No   Sexual activity: Not on file  Other Topics Concern   Not on file  Social History Narrative   Not on file   Social Determinants of Health   Financial Resource Strain: Not on file  Food Insecurity: Not on file  Transportation Needs: Not on file  Physical Activity: Not on file  Stress: Not on file  Social Connections: Not on file  Intimate Partner Violence: Not on file    Family History:   The patient's family history includes Cancer in her brother; Diabetes in her daughter and son; Heart disease in her brother; Hyperlipidemia in  her brother, daughter, and son; Hypertension in her brother and son; Varicose Veins in her brother.    ROS:  Please see the history of present illness.  General:no colds or fevers, no weight changes Skin:no rashes or ulcers HEENT:no blurred vision, no congestion CV:see HPI PUL:see HPI GI:no diarrhea constipation or melena, no indigestion GU:no hematuria, no dysuria MS:no joint pain, no claudication Neuro:no syncope, no lightheadedness Endo:no diabetes, no thyroid disease   All other ROS reviewed and negative.     Physical Exam/Data:   Vitals:   04/01/21 1324  SpO2: 94%  Weight: 52.2 kg  Height: 5' (1.524 m)   No intake or output data in the 24 hours ending 04/01/21 1340 Last 3 Weights 04/01/2021 02/11/2014  Weight (lbs) 115 lb 136 lb 1.6 oz  Weight (kg) 52.164 kg 61.735 kg     Body  mass index is 22.46 kg/m.  General:  Well nourished, well developed, in no acute distress HEENT: normal Neck: no JVD lying flat Vascular: No carotid bruits; Distal pulses 2+ bilaterally  + varicosities Cardiac:  normal S1, S2; RRR; no murmur gallup rub or click Lungs:  clear to auscultation bilaterally, no wheezing, rhonchi or rales  Abd: soft, nontender, no hepatomegaly  Ext: no edema Musculoskeletal:  No deformities, BUE and BLE strength normal and equal Skin: warm and dry  Neuro:  alert and oriented X 3 MAE follows commands, no focal abnormalities noted Psych:  Normal affect    EKG:  The ECG that was done in [facility]  was personally reviewed and demonstrates SR with ST elevation inf leads with ant reipical changes.   Relevant CV Studies: None, echo ordered  Laboratory Data:  High Sensitivity Troponin:  No results for input(s): TROPONINIHS in the last 720 hours.    ChemistryNo results for input(s): NA, K, CL, CO2, GLUCOSE, BUN, CREATININE, CALCIUM, MG, GFRNONAA, GFRAA, ANIONGAP in the last 168 hours.  No results for input(s): PROT, ALBUMIN, AST, ALT, ALKPHOS, BILITOT in the last 168 hours. Lipids No results for input(s): CHOL, TRIG, HDL, LABVLDL, LDLCALC, CHOLHDL in the last 168 hours. HematologyNo results for input(s): WBC, RBC, HGB, HCT, MCV, MCH, MCHC, RDW, PLT in the last 168 hours. Thyroid No results for input(s): TSH, FREET4 in the last 168 hours. BNPNo results for input(s): BNP, PROBNP in the last 168 hours.  DDimer No results for input(s): DDIMER in the last 168 hours.   Radiology/Studies:  No results found.   Assessment and Plan:   STEMI of inf wall.  Direct to cath lab. Discussed cath. No hx of CAD, check A1c, lipids begin BB, ASA and Birlinta,  she did receive Brilinta at [facility].   Check lipids begin high dose statin, will order echo HTN  add BB    Risk Assessment/Risk Scores:    TIMI Risk Score for ST  Elevation MI:   The patient's TIMI risk score is 5,  which indicates a 12.4% risk of all cause mortality at 30 days.        Severity of Illness: The appropriate patient status for this patient is INPATIENT. Inpatient status is judged to be reasonable and necessary in order to provide the required intensity of service to ensure the patient's safety. The patient's presenting symptoms, physical exam findings, and initial radiographic and laboratory data in the context of their chronic comorbidities is felt to place them at high risk for further clinical deterioration. Furthermore, it is not anticipated that the patient will be medically stable for discharge  from the hospital within 2 midnights of admission.   * I certify that at the point of admission it is my clinical judgment that the patient will require inpatient hospital care spanning beyond 2 midnights from the point of admission due to high intensity of service, high risk for further deterioration and high frequency of surveillance required.*   For questions or updates, please contact [facility] Please consult [email or web address] for contact info under     Signed, Cecilie Kicks, NP  04/01/2021 1:40 PM    Patient seen, examined. Available data reviewed. Agree with findings, assessment, and plan as outlined by Cecilie Kicks, NP.  The patient is independently interviewed and examined.  She is alert, oriented, and mild distress from chest discomfort.  HEENT is normal, carotid upstrokes normal without bruits, lungs are clear bilaterally, heart is regular rate and rhythm with no murmur gallop, abdomen is soft nontender, does have no significant edema, skin is warm and dry with no rash.  EKG shows sinus rhythm with acute inferior, posterior, and lateral injury consistent with an inferoposterior STEMI.  The patient arrives for emergency cardiac catheterization and PCI.  She has been administered IV heparin and an oral loading dose of ticagrelor.  We will proceed emergently with cardiac catheterization and  PCI.  Emergency implied consent is obtained.  I discussed the procedure with the patient, her plans to proceed from right radial access, and procedural expectations.  Further plans/disposition pending her cardiac catheterization/PCI result.  We will institute appropriate post MI medical therapy for secondary risk reduction.  Sherren Mocha, M.D. 04/01/2021 2:54 PM

## 2021-04-02 DIAGNOSIS — I340 Nonrheumatic mitral (valve) insufficiency: Secondary | ICD-10-CM

## 2021-04-02 DIAGNOSIS — R410 Disorientation, unspecified: Secondary | ICD-10-CM

## 2021-04-02 LAB — CBC
HCT: 42.2 % (ref 36.0–46.0)
Hemoglobin: 14.3 g/dL (ref 12.0–15.0)
MCH: 33 pg (ref 26.0–34.0)
MCHC: 33.9 g/dL (ref 30.0–36.0)
MCV: 97.5 fL (ref 80.0–100.0)
Platelets: 198 10*3/uL (ref 150–400)
RBC: 4.33 MIL/uL (ref 3.87–5.11)
RDW: 12.9 % (ref 11.5–15.5)
WBC: 8.6 10*3/uL (ref 4.0–10.5)
nRBC: 0 % (ref 0.0–0.2)

## 2021-04-02 LAB — BASIC METABOLIC PANEL
Anion gap: 11 (ref 5–15)
BUN: 12 mg/dL (ref 8–23)
CO2: 26 mmol/L (ref 22–32)
Calcium: 8.9 mg/dL (ref 8.9–10.3)
Chloride: 100 mmol/L (ref 98–111)
Creatinine, Ser: 0.94 mg/dL (ref 0.44–1.00)
GFR, Estimated: >60 mL/min (ref >60)
Glucose, Bld: 158 mg/dL — ABNORMAL HIGH (ref 70–99)
Potassium: 3.5 mmol/L (ref 3.5–5.1)
Sodium: 137 mmol/L (ref 135–145)

## 2021-04-02 LAB — POCT I-STAT 7, (LYTES, BLD GAS, ICA,H+H)
Acid-Base Excess: 3 mmol/L — ABNORMAL HIGH (ref 0.0–2.0)
Bicarbonate: 28.6 mmol/L — ABNORMAL HIGH (ref 20.0–28.0)
Calcium, Ion: 1.17 mmol/L (ref 1.15–1.40)
HCT: 42 % (ref 36.0–46.0)
Hemoglobin: 14.3 g/dL (ref 12.0–15.0)
O2 Saturation: 96 %
Patient temperature: 97.5
Potassium: 3.5 mmol/L (ref 3.5–5.1)
Sodium: 138 mmol/L (ref 135–145)
TCO2: 30 mmol/L (ref 22–32)
pCO2 arterial: 46.2 mmHg (ref 32.0–48.0)
pH, Arterial: 7.397 (ref 7.350–7.450)
pO2, Arterial: 81 mmHg — ABNORMAL LOW (ref 83.0–108.0)

## 2021-04-02 LAB — LIPID PANEL
Cholesterol: 188 mg/dL (ref 0–200)
HDL: 75 mg/dL (ref >40)
LDL Cholesterol: 92 mg/dL (ref 0–99)
Total CHOL/HDL Ratio: 2.5 RATIO
Triglycerides: 107 mg/dL (ref <150)
VLDL: 21 mg/dL (ref 0–40)

## 2021-04-02 LAB — MAGNESIUM: Magnesium: 2.3 mg/dL (ref 1.7–2.4)

## 2021-04-02 LAB — GLUCOSE, CAPILLARY: Glucose-Capillary: 170 mg/dL — ABNORMAL HIGH (ref 70–99)

## 2021-04-02 LAB — APTT: aPTT: 25 seconds (ref 24–36)

## 2021-04-02 MED ORDER — HALOPERIDOL LACTATE 5 MG/ML IJ SOLN
INTRAMUSCULAR | Status: AC
  Filled 2021-04-02: qty 1

## 2021-04-02 MED ORDER — ENOXAPARIN SODIUM 40 MG/0.4ML IJ SOSY
40.0000 mg | PREFILLED_SYRINGE | INTRAMUSCULAR | Status: DC
Start: 2021-04-02 — End: 2021-04-04
  Administered 2021-04-02 – 2021-04-03 (×2): 40 mg via SUBCUTANEOUS
  Filled 2021-04-02 (×2): qty 0.4

## 2021-04-02 MED ORDER — HALOPERIDOL LACTATE 5 MG/ML IJ SOLN
2.0000 mg | Freq: Four times a day (QID) | INTRAMUSCULAR | Status: DC | PRN
  Administered 2021-04-05 – 2021-04-06 (×4): 2 mg via INTRAVENOUS
  Filled 2021-04-02 (×3): qty 1

## 2021-04-02 MED ORDER — CHLORHEXIDINE GLUCONATE CLOTH 2 % EX PADS
6.0000 | MEDICATED_PAD | Freq: Every day | CUTANEOUS | Status: DC
  Administered 2021-04-02 – 2021-04-06 (×5): 6 via TOPICAL

## 2021-04-02 MED ORDER — DEXMEDETOMIDINE HCL IN NACL 400 MCG/100ML IV SOLN
0.4000 ug/kg/h | INTRAVENOUS | Status: DC
  Administered 2021-04-02 – 2021-04-05 (×3): 0.4 ug/kg/h via INTRAVENOUS
  Filled 2021-04-02 (×2): qty 100

## 2021-04-02 NOTE — Care Plan (Signed)
This RN was called by patient care tech. Patient is agitated and violent, not allowing nurse to touch her or readjust her in bed. Charge RN bedside, attempted to call both son and daughter to help reorient and calm patient, no answer. Patient also refusing to take oral medication at this time. Patient continues to pull at lines and attempting to get out of bed.   Dr. Brayton Layman notified in change of temperament and refusal of medication. Will continue to monitor patient.

## 2021-04-02 NOTE — H&P (View-Only) (Signed)
Progress Note  Patient Name: Virginia Roberts Date of Encounter: 04/03/2021  [facility] Cardiologist: None   Subjective   More calm this morning. Remains in restraints and on low dose precedex due to agitation overnight. No chest pain or SOB. Tentative plan for RCA intervention on Monday.   Inpatient Medications    Scheduled Meds:  aspirin EC  81 mg Oral Daily   atorvastatin  80 mg Oral Daily   Chlorhexidine Gluconate Cloth  6 each Topical Daily   enoxaparin (LOVENOX) injection  40 mg Subcutaneous Q24H   furosemide  40 mg Intravenous BID   metoprolol tartrate  12.5 mg Oral BID   oxybutynin  10 mg Oral QHS   sodium chloride flush  3 mL Intravenous Q12H   ticagrelor  90 mg Oral BID   Continuous Infusions:  sodium chloride     sodium chloride     dexmedetomidine (PRECEDEX) IV infusion 0.4 mcg/kg/hr (04/03/21 0800)   PRN Meds: sodium chloride, acetaminophen, haloperidol lactate, nitroGLYCERIN, ondansetron (ZOFRAN) IV, sodium chloride flush   Vital Signs    Vitals:   04/03/21 0500 04/03/21 0600 04/03/21 0700 04/03/21 0800  BP: 96/65 109/74 108/62 102/64  Pulse: 60 67 63 63  Resp: 16 15 16 17   Temp:      TempSrc:      SpO2: 100% 99% 100% 98%  Weight:      Height:        Intake/Output Summary (Last 24 hours) at 04/03/2021 0936 Last data filed at 04/03/2021 0800 Gross per 24 hour  Intake 52.25 ml  Output 1215 ml  Net -1162.75 ml   Last 3 Weights 04/01/2021 02/11/2014  Weight (lbs) 115 lb 136 lb 1.6 oz  Weight (kg) 52.164 kg 61.735 kg      Telemetry    NSR, frequent PVCs- Personally Reviewed  ECG    NSR, TWI anterolaterally and inferiorly - Personally Reviewed  Physical Exam   GEN: Elderly female, calm and answering questions this AM Neck: No JVD Cardiac: RR, 2/6 systolic murmur Respiratory: CTAB GI: Soft, nontender, non-distended  MS: Warm, trace edema. Right radial site with large hematoma that is soft. Palpable radial pulse Neuro:  Calm,  answering questions appropriately, no focal deficits Psych: More calm this AM  Labs    High Sensitivity Troponin:   Recent Labs  Lab 04/01/21 1342 04/01/21 1650  TROPONINIHS 95* 19,301*     Chemistry Recent Labs  Lab 04/01/21 1342 04/02/21 0053 04/02/21 0243 04/03/21 0243  NA 137 138 137 137  K 3.2* 3.5 3.5 3.5  CL 104  --  100 100  CO2 25  --  26 29  GLUCOSE 132*  --  158* 135*  BUN 14  --  12 17  CREATININE 0.81  --  0.94 1.08*  CALCIUM 8.7*  --  8.9 8.5*  MG 1.7  --  2.3 1.9  PROT 5.7*  --   --   --   ALBUMIN 3.7  --   --   --   AST 34  --   --   --   ALT 19  --   --   --   ALKPHOS 50  --   --   --   BILITOT 1.2  --   --   --   GFRNONAA >60  --  >60 52*  ANIONGAP 8  --  11 8    Lipids  Recent Labs  Lab 04/02/21 0243  CHOL 188  TRIG 107  HDL 75  LDLCALC 92  CHOLHDL 2.5    Hematology Recent Labs  Lab 04/01/21 1342 04/02/21 0053 04/02/21 0243 04/03/21 0243  WBC 6.8  --  8.6 9.6  RBC 3.78*  --  4.33 3.77*  HGB 12.1 14.3 14.3 12.2  HCT 37.0 42.0 42.2 37.1  MCV 97.9  --  97.5 98.4  MCH 32.0  --  33.0 32.4  MCHC 32.7  --  33.9 32.9  RDW 12.8  --  12.9 12.9  PLT 173  --  198 147*   Thyroid  Recent Labs  Lab 04/01/21 1650  TSH 1.384  FREET4 1.44*    BNP Recent Labs  Lab 04/01/21 1351  BNP 982.3*    DDimer No results for input(s): DDIMER in the last 168 hours.   Radiology    CARDIAC CATHETERIZATION  Result Date: 04/01/2021   Mid RCA lesion is 75% stenosed.   Mid Cx lesion is 100% stenosed.   2nd Mrg lesion is 100% stenosed.   Prox LAD to Mid LAD lesion is 40% stenosed.   A drug-eluting stent was successfully placed using a STENT ONYX FRONTIER 3.0X26.   Post intervention, there is a 0% residual stenosis. Acute inferoposterior STEMI secondary to occlusion of the mid circumflex, treated successfully with primary PCI using a 3.0x26 mm Onyx Frontier DES. Residual downstream embolus/thrombus results in occluded OM sub-branch Severe mid-RCA  stenosis of 75% Nonobstructive left main and LAD plaquing without severe stenoses Moderately elevated LVEDP Plan: post-MI medical therapy, check 2D echo, recommend staged PCI of the RCA Monday if no complications. **for future cath do NOT use right radial artery**   DG CHEST PORT 1 VIEW  Result Date: 04/01/2021 CLINICAL DATA:  Edema EXAM: PORTABLE CHEST 1 VIEW COMPARISON:  Report 10/07/2011 FINDINGS: Cardiomegaly with vascular congestion. Diffuse bilateral interstitial and ground-glass opacity, suspicious for pulmonary edema. Probable tiny pleural effusions. Asymmetric right apical opacity. IMPRESSION: 1. Cardiomegaly with vascular congestion and diffuse bilateral interstitial and ground-glass opacity, suspect for pulmonary edema. Trace pleural effusion 2. Asymmetrical masslike right apical opacity, concerning for lung mass. Recommend chest CT for further evaluation These results will be called to the ordering clinician or representative by the Radiologist Assistant, and communication documented in the PACS or [employer]. Electronically Signed   By: Jasmine Pang M.D.   On: 04/01/2021 20:36   ECHOCARDIOGRAM COMPLETE  Result Date: 04/01/2021    ECHOCARDIOGRAM REPORT   Patient Name:   Virginia Roberts Date of Exam: 04/01/2021 Medical Rec #:  [id number]      Height:       60.0 in Accession #:    [id number]     Weight:       115.0 lb Date of Birth:  July 06, 1940      BSA:          1.475 m Patient Age:    80 years       BP:           99/88 mmHg Patient Gender: F              HR:           81 bpm. Exam Location:  Inpatient Procedure: 2D Echo, Cardiac Doppler and Color Doppler Indications:    Chest pain  History:        Patient has no prior history of Echocardiogram examinations.                 Acute MI; Risk Factors:Hypertension. Hx DVT. S/P PCI.  Sonographer:    Ross Ludwig RDCS (AE) Referring Phys: [id number] MICHAEL COOPER IMPRESSIONS  1. Left ventricular ejection fraction, by estimation, is 45 to 50%. The left  ventricle has mildly decreased function. The left ventricle demonstrates global hypokinesis. There is mild left ventricular hypertrophy of the basal segment.  2. Right ventricular systolic function is normal. The right ventricular size is moderately enlarged. There is severely elevated pulmonary artery systolic pressure.  3. Left atrial size was severely dilated.  4. Right atrial size was severely dilated.  5. Severe mitral valve regurgitation.  6. Tricuspid valve regurgitation is severe.  7. Aortic valve regurgitation is moderate to severe.  8. Pulmonic valve regurgitation is moderate to severe. FINDINGS  Left Ventricle: Left ventricular ejection fraction, by estimation, is 45 to 50%. The left ventricle has mildly decreased function. The left ventricle demonstrates global hypokinesis. The left ventricular internal cavity size was normal in size. There is  mild left ventricular hypertrophy of the basal segment. Right Ventricle: The right ventricular size is moderately enlarged. No increase in right ventricular wall thickness. Right ventricular systolic function is normal. There is severely elevated pulmonary artery systolic pressure. The tricuspid regurgitant velocity is 3.58 m/s, and with an assumed right atrial pressure of 15 mmHg, the estimated right ventricular systolic pressure is 66.3 mmHg. Left Atrium: Left atrial size was severely dilated. Right Atrium: Right atrial size was severely dilated. Pericardium: There is no evidence of pericardial effusion. Mitral Valve: There is moderate calcification of the mitral valve leaflet(s). Severe mitral valve regurgitation. 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 Aortic Valve: Aortic valve regurgitation is moderate to severe. Aortic regurgitation PHT measures 322 msec. Aortic valve mean gradient measures 3.0 mmHg. Aortic valve peak gradient measures 6.2 mmHg. Aortic valve area, by VTI measures 3.05 cm. Pulmonic Valve: Pulmonic valve regurgitation is  moderate to severe.  LEFT VENTRICLE PLAX 2D LVIDd:         4.80 cm LVIDs:         3.70 cm LV PW:         1.10 cm LV IVS:        1.20 cm LVOT diam:     2.00 cm LV SV:         77 LV SV Index:   52 LVOT Area:     3.14 cm  RIGHT VENTRICLE             IVC RV Basal diam:  4.70 cm     IVC diam: 2.50 cm RV Mid diam:    3.40 cm RV S prime:     15.30 cm/s TAPSE (M-mode): 2.5 cm LEFT ATRIUM             Index        RIGHT ATRIUM           Index LA diam:        4.20 cm 2.85 cm/m   RA Area:     15.80 cm LA Vol (A2C):   74.9 ml 50.77 ml/m  RA Volume:   41.00 ml  27.79 ml/m LA Vol (A4C):   77.7 ml 52.66 ml/m LA Biplane Vol: 84.1 ml 57.00 ml/m  AORTIC VALVE AV Area (Vmax):    2.79 cm AV Area (Vmean):   2.73 cm AV Area (VTI):     3.05 cm AV Vmax:           124.00 cm/s AV Vmean:          86.500 cm/s AV VTI:  0.252 m AV Peak Grad:      6.2 mmHg AV Mean Grad:      3.0 mmHg LVOT Vmax:         110.00 cm/s LVOT Vmean:        75.100 cm/s LVOT VTI:          0.245 m LVOT/AV VTI ratio: 0.97 AI PHT:            322 msec  AORTA Ao Root diam: 3.60 cm Ao Asc diam:  3.50 cm MR Peak grad:    104.9 mmHg   TRICUSPID VALVE MR Mean grad:    68.0 mmHg    TR Peak grad:   51.3 mmHg MR Vmax:         512.00 cm/s  TR Vmax:        358.00 cm/s MR Vmean:        384.0 cm/s MR PISA:         3.08 cm     SHUNTS MR PISA Eff ROA: 19 mm       Systemic VTI:  0.24 m MR PISA Radius:  0.70 cm      Systemic Diam: 2.00 cm Carolan Clines Electronically signed by Carolan Clines Signature Date/Time: 04/01/2021/7:02:41 PM    Final     Cardiac Studies   TTE 04/01/21: IMPRESSIONS   1. Left ventricular ejection fraction, by estimation, is 45 to 50%. The  left ventricle has mildly decreased function. The left ventricle  demonstrates global hypokinesis. There is mild left ventricular  hypertrophy of the basal segment.   2. Right ventricular systolic function is normal. The right ventricular  size is moderately enlarged. There is severely elevated pulmonary  artery  systolic pressure.   3. Left atrial size was severely dilated.   4. Right atrial size was severely dilated.   5. Severe mitral valve regurgitation.   6. Tricuspid valve regurgitation is severe.   7. Aortic valve regurgitation is moderate to severe.   8. Pulmonic valve regurgitation is moderate to severe.   LHC 04/01/21:   Mid RCA lesion is 75% stenosed.   Mid Cx lesion is 100% stenosed.   2nd Mrg lesion is 100% stenosed.   Prox LAD to Mid LAD lesion is 40% stenosed.   A drug-eluting stent was successfully placed using a STENT ONYX FRONTIER 3.0X26.   Post intervention, there is a 0% residual stenosis.   Acute inferoposterior STEMI secondary to occlusion of the mid circumflex, treated successfully with primary PCI using a 3.0x26 mm Onyx Frontier DES. 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    **for future cath do NOT use right radial artery**  Patient Profile     81 y.o. female HTN and remote history of DVT who presented with chest pain found to have STE in inferior leads consistent with STEMI. Cath with 100% mid-Lcx and Om2 s/p PCI to the mid-Lcx with distal embolization to OM sub-branch. Also with 75% RCA now planned for staged PCI on Monday.  Assessment & Plan    #STEMI: #Multivessel CAD: Patient presented with chest pain found to have inferior STE with reciprocal changes consistent with STEMI. Cath with 75% mid-RCA, 100% mid-Lcx, 100% Om2, 40% prox-mid LAD. Underwent successful PCI of Lcx with distal embolization to OM sub-branch. Planned  for RCA intervention on Monday.  °-S/p successful PCI to mid-Lcx as above °-Plan for staged RCA intervention on Monday °-NO RIGHT RADIAL ACCESS °-Continue ASA 81mg daily, brilinta  90mg BID °-Continue lipitor 80mg daily °-Continue metop 12.5mg BID; transition to long-acting prior to discharge °-Will add ARB/ACE post-cath as able pending renal function; BP too soft currently ° °#Acute Systolic HF: °Newly diagnosed in the setting of STEMI. LVEF 45-50% on TTE with global hypokinesis. LVEDP on cath 21mmHg. 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 Suspect MR and TR will improve with diuresis, however PASP severely elevated and LA severely dilated which may suggest patient has long-standing severe MR. Will need repeat TTE as out-patient once more clinically euvolemic and if persistently severe MR, may need surgery vs clip. ° °#Severe Pulmonary HTN: °In the setting of severe MR as detailed above. Will reassess following diuresis and may need MVR in the future.  °-Diuresis as above °-Will need repeat TTE for monitoring as out-patient and if persistent severe MR with pulmonary HTN and reduced EF, may merit MVR if deemed a candidate ° °#Agitation: °#Delirium: °Very agitated at night and ripping at lines. Was placed on precedex with improvement.  °-Continue precedex; wean as able °-Frequent re-orientation °-Will order soft restraints to protect the patient ° °#Right radial hematoma: °Developed post-cath in the setting of significant agitation. Hematoma soft. Radial pulse palpable.  °-Continue conservative management °-Trend hemoglobin ° °CRITICAL CARE TIME: °I have spent a total of 35 minutes with patient reviewing hospital notes, telemetry, EKGs, labs and examining the patient as well as establishing an assessment and  plan that was discussed with the patient.  > 50% of time was spent in direct patient care. The patient is critically ill with multi-organ system failure and requires high complexity decision making for assessment and support, frequent evaluation and titration of therapies, application of advanced monitoring technologies and extensive interpretation of multiple databases.  ° °For questions or updates, please contact [facility] °Please consult [email or web address] for contact info under  ° °  °   °Signed, °Dhillon Comun[place] E Ayeden Gladman, MD  °04/03/2021, 9:36 AM    °

## 2021-04-02 NOTE — Care Plan (Signed)
This RN entered room when RN noticed leads were no longer attached. RN found patient standing next to bathroom, IV pulled out, gown off, and O2 off. Pt agitated and needed frequent reorientation. Dr. Brayton Layman notified, soft wrist restraints ordered and initiated. Restraints applied according to facility policy and ongoing assessment and documentation initiated.

## 2021-04-02 NOTE — Progress Notes (Signed)
Progress Note  Patient Name: Virginia Roberts Date of Encounter: 04/03/2021  [facility] Cardiologist: None   Subjective   More calm this morning. Remains in restraints and on low dose precedex due to agitation overnight. No chest pain or SOB. Tentative plan for RCA intervention on Monday.   Inpatient Medications    Scheduled Meds:  aspirin EC  81 mg Oral Daily   atorvastatin  80 mg Oral Daily   Chlorhexidine Gluconate Cloth  6 each Topical Daily   enoxaparin (LOVENOX) injection  40 mg Subcutaneous Q24H   furosemide  40 mg Intravenous BID   metoprolol tartrate  12.5 mg Oral BID   oxybutynin  10 mg Oral QHS   sodium chloride flush  3 mL Intravenous Q12H   ticagrelor  90 mg Oral BID   Continuous Infusions:  sodium chloride     sodium chloride     dexmedetomidine (PRECEDEX) IV infusion 0.4 mcg/kg/hr (04/03/21 0800)   PRN Meds: sodium chloride, acetaminophen, haloperidol lactate, nitroGLYCERIN, ondansetron (ZOFRAN) IV, sodium chloride flush   Vital Signs    Vitals:   04/03/21 0500 04/03/21 0600 04/03/21 0700 04/03/21 0800  BP: 96/65 109/74 108/62 102/64  Pulse: 60 67 63 63  Resp: 16 15 16 17   Temp:      TempSrc:      SpO2: 100% 99% 100% 98%  Weight:      Height:        Intake/Output Summary (Last 24 hours) at 04/03/2021 0936 Last data filed at 04/03/2021 0800 Gross per 24 hour  Intake 52.25 ml  Output 1215 ml  Net -1162.75 ml   Last 3 Weights 04/01/2021 02/11/2014  Weight (lbs) 115 lb 136 lb 1.6 oz  Weight (kg) 52.164 kg 61.735 kg      Telemetry    NSR, frequent PVCs- Personally Reviewed  ECG    NSR, TWI anterolaterally and inferiorly - Personally Reviewed  Physical Exam   GEN: Elderly female, calm and answering questions this AM Neck: No JVD Cardiac: RR, 2/6 systolic murmur Respiratory: CTAB GI: Soft, nontender, non-distended  MS: Warm, trace edema. Right radial site with large hematoma that is soft. Palpable radial pulse Neuro:  Calm,  answering questions appropriately, no focal deficits Psych: More calm this AM  Labs    High Sensitivity Troponin:   Recent Labs  Lab 04/01/21 1342 04/01/21 1650  TROPONINIHS 95* 19,301*     Chemistry Recent Labs  Lab 04/01/21 1342 04/02/21 0053 04/02/21 0243 04/03/21 0243  NA 137 138 137 137  K 3.2* 3.5 3.5 3.5  CL 104  --  100 100  CO2 25  --  26 29  GLUCOSE 132*  --  158* 135*  BUN 14  --  12 17  CREATININE 0.81  --  0.94 1.08*  CALCIUM 8.7*  --  8.9 8.5*  MG 1.7  --  2.3 1.9  PROT 5.7*  --   --   --   ALBUMIN 3.7  --   --   --   AST 34  --   --   --   ALT 19  --   --   --   ALKPHOS 50  --   --   --   BILITOT 1.2  --   --   --   GFRNONAA >60  --  >60 52*  ANIONGAP 8  --  11 8    Lipids  Recent Labs  Lab 04/02/21 0243  CHOL 188  TRIG 107  HDL 75  LDLCALC 92  CHOLHDL 2.5    Hematology Recent Labs  Lab 04/01/21 1342 04/02/21 0053 04/02/21 0243 04/03/21 0243  WBC 6.8  --  8.6 9.6  RBC 3.78*  --  4.33 3.77*  HGB 12.1 14.3 14.3 12.2  HCT 37.0 42.0 42.2 37.1  MCV 97.9  --  97.5 98.4  MCH 32.0  --  33.0 32.4  MCHC 32.7  --  33.9 32.9  RDW 12.8  --  12.9 12.9  PLT 173  --  198 147*   Thyroid  Recent Labs  Lab 04/01/21 1650  TSH 1.384  FREET4 1.44*    BNP Recent Labs  Lab 04/01/21 1351  BNP 982.3*    DDimer No results for input(s): DDIMER in the last 168 hours.   Radiology    CARDIAC CATHETERIZATION  Result Date: 04/01/2021   Mid RCA lesion is 75% stenosed.   Mid Cx lesion is 100% stenosed.   2nd Mrg lesion is 100% stenosed.   Prox LAD to Mid LAD lesion is 40% stenosed.   A drug-eluting stent was successfully placed using a STENT ONYX FRONTIER 3.0X26.   Post intervention, there is a 0% residual stenosis. Acute inferoposterior STEMI secondary to occlusion of the mid circumflex, treated successfully with primary PCI using a 3.0x26 mm Onyx Frontier DES. Residual downstream embolus/thrombus results in occluded OM sub-branch Severe mid-RCA  stenosis of 75% Nonobstructive left main and LAD plaquing without severe stenoses Moderately elevated LVEDP Plan: post-MI medical therapy, check 2D echo, recommend staged PCI of the RCA Monday if no complications. **for future cath do NOT use right radial artery**   DG CHEST PORT 1 VIEW  Result Date: 04/01/2021 CLINICAL DATA:  Edema EXAM: PORTABLE CHEST 1 VIEW COMPARISON:  Report 10/07/2011 FINDINGS: Cardiomegaly with vascular congestion. Diffuse bilateral interstitial and ground-glass opacity, suspicious for pulmonary edema. Probable tiny pleural effusions. Asymmetric right apical opacity. IMPRESSION: 1. Cardiomegaly with vascular congestion and diffuse bilateral interstitial and ground-glass opacity, suspect for pulmonary edema. Trace pleural effusion 2. Asymmetrical masslike right apical opacity, concerning for lung mass. Recommend chest CT for further evaluation These results will be called to the ordering clinician or representative by the Radiologist Assistant, and communication documented in the PACS or [employer]. Electronically Signed   By: Jasmine Pang M.D.   On: 04/01/2021 20:36   ECHOCARDIOGRAM COMPLETE  Result Date: 04/01/2021    ECHOCARDIOGRAM REPORT   Patient Name:   Virginia Roberts Date of Exam: 04/01/2021 Medical Rec #:  [id number]      Height:       60.0 in Accession #:    [id number]     Weight:       115.0 lb Date of Birth:  July 06, 1940      BSA:          1.475 m Patient Age:    80 years       BP:           99/88 mmHg Patient Gender: F              HR:           81 bpm. Exam Location:  Inpatient Procedure: 2D Echo, Cardiac Doppler and Color Doppler Indications:    Chest pain  History:        Patient has no prior history of Echocardiogram examinations.                 Acute MI; Risk Factors:Hypertension. Hx DVT. S/P PCI.  Sonographer:    Ross Ludwig RDCS (AE) Referring Phys: [id number] MICHAEL COOPER IMPRESSIONS  1. Left ventricular ejection fraction, by estimation, is 45 to 50%. The left  ventricle has mildly decreased function. The left ventricle demonstrates global hypokinesis. There is mild left ventricular hypertrophy of the basal segment.  2. Right ventricular systolic function is normal. The right ventricular size is moderately enlarged. There is severely elevated pulmonary artery systolic pressure.  3. Left atrial size was severely dilated.  4. Right atrial size was severely dilated.  5. Severe mitral valve regurgitation.  6. Tricuspid valve regurgitation is severe.  7. Aortic valve regurgitation is moderate to severe.  8. Pulmonic valve regurgitation is moderate to severe. FINDINGS  Left Ventricle: Left ventricular ejection fraction, by estimation, is 45 to 50%. The left ventricle has mildly decreased function. The left ventricle demonstrates global hypokinesis. The left ventricular internal cavity size was normal in size. There is  mild left ventricular hypertrophy of the basal segment. Right Ventricle: The right ventricular size is moderately enlarged. No increase in right ventricular wall thickness. Right ventricular systolic function is normal. There is severely elevated pulmonary artery systolic pressure. The tricuspid regurgitant velocity is 3.58 m/s, and with an assumed right atrial pressure of 15 mmHg, the estimated right ventricular systolic pressure is 66.3 mmHg. Left Atrium: Left atrial size was severely dilated. Right Atrium: Right atrial size was severely dilated. Pericardium: There is no evidence of pericardial effusion. Mitral Valve: There is moderate calcification of the mitral valve leaflet(s). Severe mitral valve regurgitation. 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 Aortic Valve: Aortic valve regurgitation is moderate to severe. Aortic regurgitation PHT measures 322 msec. Aortic valve mean gradient measures 3.0 mmHg. Aortic valve peak gradient measures 6.2 mmHg. Aortic valve area, by VTI measures 3.05 cm. Pulmonic Valve: Pulmonic valve regurgitation is  moderate to severe.  LEFT VENTRICLE PLAX 2D LVIDd:         4.80 cm LVIDs:         3.70 cm LV PW:         1.10 cm LV IVS:        1.20 cm LVOT diam:     2.00 cm LV SV:         77 LV SV Index:   52 LVOT Area:     3.14 cm  RIGHT VENTRICLE             IVC RV Basal diam:  4.70 cm     IVC diam: 2.50 cm RV Mid diam:    3.40 cm RV S prime:     15.30 cm/s TAPSE (M-mode): 2.5 cm LEFT ATRIUM             Index        RIGHT ATRIUM           Index LA diam:        4.20 cm 2.85 cm/m   RA Area:     15.80 cm LA Vol (A2C):   74.9 ml 50.77 ml/m  RA Volume:   41.00 ml  27.79 ml/m LA Vol (A4C):   77.7 ml 52.66 ml/m LA Biplane Vol: 84.1 ml 57.00 ml/m  AORTIC VALVE AV Area (Vmax):    2.79 cm AV Area (Vmean):   2.73 cm AV Area (VTI):     3.05 cm AV Vmax:           124.00 cm/s AV Vmean:          86.500 cm/s AV VTI:  0.252 m AV Peak Grad:      6.2 mmHg AV Mean Grad:      3.0 mmHg LVOT Vmax:         110.00 cm/s LVOT Vmean:        75.100 cm/s LVOT VTI:          0.245 m LVOT/AV VTI ratio: 0.97 AI PHT:            322 msec  AORTA Ao Root diam: 3.60 cm Ao Asc diam:  3.50 cm MR Peak grad:    104.9 mmHg   TRICUSPID VALVE MR Mean grad:    68.0 mmHg    TR Peak grad:   51.3 mmHg MR Vmax:         512.00 cm/s  TR Vmax:        358.00 cm/s MR Vmean:        384.0 cm/s MR PISA:         3.08 cm     SHUNTS MR PISA Eff ROA: 19 mm       Systemic VTI:  0.24 m MR PISA Radius:  0.70 cm      Systemic Diam: 2.00 cm Carolan Clines Electronically signed by Carolan Clines Signature Date/Time: 04/01/2021/7:02:41 PM    Final     Cardiac Studies   TTE 04/01/21: IMPRESSIONS   1. Left ventricular ejection fraction, by estimation, is 45 to 50%. The  left ventricle has mildly decreased function. The left ventricle  demonstrates global hypokinesis. There is mild left ventricular  hypertrophy of the basal segment.   2. Right ventricular systolic function is normal. The right ventricular  size is moderately enlarged. There is severely elevated pulmonary  artery  systolic pressure.   3. Left atrial size was severely dilated.   4. Right atrial size was severely dilated.   5. Severe mitral valve regurgitation.   6. Tricuspid valve regurgitation is severe.   7. Aortic valve regurgitation is moderate to severe.   8. Pulmonic valve regurgitation is moderate to severe.   LHC 04/01/21:   Mid RCA lesion is 75% stenosed.   Mid Cx lesion is 100% stenosed.   2nd Mrg lesion is 100% stenosed.   Prox LAD to Mid LAD lesion is 40% stenosed.   A drug-eluting stent was successfully placed using a STENT ONYX FRONTIER 3.0X26.   Post intervention, there is a 0% residual stenosis.   Acute inferoposterior STEMI secondary to occlusion of the mid circumflex, treated successfully with primary PCI using a 3.0x26 mm Onyx Frontier DES. 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    **for future cath do NOT use right radial artery**  Patient Profile     81 y.o. female HTN and remote history of DVT who presented with chest pain found to have STE in inferior leads consistent with STEMI. Cath with 100% mid-Lcx and Om2 s/p PCI to the mid-Lcx with distal embolization to OM sub-branch. Also with 75% RCA now planned for staged PCI on Monday.  Assessment & Plan    #STEMI: #Multivessel CAD: Patient presented with chest pain found to have inferior STE with reciprocal changes consistent with STEMI. Cath with 75% mid-RCA, 100% mid-Lcx, 100% Om2, 40% prox-mid LAD. Underwent successful PCI of Lcx with distal embolization to OM sub-branch. Planned  for RCA intervention on Monday.  -S/p successful PCI to mid-Lcx as above -Plan for staged RCA intervention on Monday -NO RIGHT RADIAL ACCESS -Continue ASA  daily, brilinta   BID -Continue lipitor  daily -Continue metop 12.5mg  BID; transition to long-acting prior to discharge -Will add ARB/ACE post-cath as able pending renal function; BP too soft currently  #Acute Systolic HF: Newly diagnosed in the setting of STEMI. LVEF 45-50% on TTE with global hypokinesis. LVEDP on cath . 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 Suspect MR and TR will improve with diuresis, however PASP severely elevated and LA severely dilated which may suggest patient has long-standing severe MR. Will need repeat TTE as out-patient once more clinically euvolemic and if persistently severe MR, may need surgery vs clip.  #Severe Pulmonary HTN: In the setting of severe MR as detailed above. Will reassess following diuresis and may need MVR in the future.  -Diuresis as above -Will need repeat TTE for monitoring as out-patient and if persistent severe MR with pulmonary HTN and reduced EF, may merit MVR if deemed a candidate  #Agitation: #Delirium: Very agitated at night and ripping at lines. Was placed on precedex with improvement.  -Continue precedex; wean as able -Frequent re-orientation -Will order soft restraints to protect the patient  #Right radial hematoma: Developed post-cath in the setting of significant agitation. Hematoma soft. Radial pulse palpable.  -Continue conservative management -Trend hemoglobin  CRITICAL CARE TIME: I have spent a total of 35 minutes with patient reviewing hospital notes, telemetry, EKGs, labs and examining the patient as well as establishing an assessment and  plan that was discussed with the patient.  > 50% of time was spent in direct patient care. The patient is critically ill with multi-organ system failure and requires high complexity decision making for assessment and support, frequent evaluation and titration of therapies, application of advanced monitoring technologies and extensive interpretation of multiple databases.   For questions or updates, please contact [facility] Please consult [email or web address] for contact info under        Signed, Meriam Sprague, MD  04/03/2021, 9:36 AM

## 2021-04-02 NOTE — Progress Notes (Signed)
Multiple calls from RN all night regarding patient's delirium/ agitated  - multiple failed attempts to re-orient her - soft restraints were placed, still patient very agitated, trying to pull off everything. -noted small hematoma on the right arm beyond the TR band site. TR band was off last night. - she waxes and wanes. When I saw her- she is Aox3 but gets agitated and starts pulling off things.  - good UO with IV lasix, still volume up and crackles in the lungs. Maintaining sats in >92% on high flow oxygen.   - I think this is ICU delirium causing her confusion and not hypoxia. Her BP is stable and she is warm  -> start precedex low dose (we have no option other than sedating her) as delirium is out of control and she may be harm to herself with voilent behaviour. -> keep it at low dose as it causes bradycardia, resrpiratory depression -> reorient from family help, staff as much as possible - No wbc, no signs of infection -> continue IV diuresis for decompensated HFrEF

## 2021-04-03 LAB — CBC
HCT: 37.1 % (ref 36.0–46.0)
Hemoglobin: 12.2 g/dL (ref 12.0–15.0)
MCH: 32.4 pg (ref 26.0–34.0)
MCHC: 32.9 g/dL (ref 30.0–36.0)
MCV: 98.4 fL (ref 80.0–100.0)
Platelets: 147 10*3/uL — ABNORMAL LOW (ref 150–400)
RBC: 3.77 MIL/uL — ABNORMAL LOW (ref 3.87–5.11)
RDW: 12.9 % (ref 11.5–15.5)
WBC: 9.6 10*3/uL (ref 4.0–10.5)
nRBC: 0 % (ref 0.0–0.2)

## 2021-04-03 LAB — BASIC METABOLIC PANEL
Anion gap: 8 (ref 5–15)
BUN: 17 mg/dL (ref 8–23)
CO2: 29 mmol/L (ref 22–32)
Calcium: 8.5 mg/dL — ABNORMAL LOW (ref 8.9–10.3)
Chloride: 100 mmol/L (ref 98–111)
Creatinine, Ser: 1.08 mg/dL — ABNORMAL HIGH (ref 0.44–1.00)
GFR, Estimated: 52 mL/min — ABNORMAL LOW (ref >60)
Glucose, Bld: 135 mg/dL — ABNORMAL HIGH (ref 70–99)
Potassium: 3.5 mmol/L (ref 3.5–5.1)
Sodium: 137 mmol/L (ref 135–145)

## 2021-04-03 LAB — MAGNESIUM: Magnesium: 1.9 mg/dL (ref 1.7–2.4)

## 2021-04-03 MED ORDER — POTASSIUM CHLORIDE 20 MEQ PO PACK
40.0000 meq | PACK | Freq: Once | ORAL | Status: AC
  Administered 2021-04-03: 10:00:00 40 meq via ORAL
  Filled 2021-04-03: qty 2

## 2021-04-03 NOTE — Care Management (Signed)
°  Transition of Care (TOC) Screening Note   Patient Details  Name: Virginia Roberts Date of Birth: Sep 08, 1940   Transition of Care [facility]) CM/SW Contact:    Lawerance Sabal, RN Phone Number: 04/03/2021, 7:30 AM    Transition of Care Department [facility]) has reviewed patient and no TOC needs have been identified at this time. We will continue to monitor patient advancement through interdisciplinary progression rounds. If new patient transition needs arise, please place a TOC consult.

## 2021-04-04 ENCOUNTER — Encounter (HOSPITAL_COMMUNITY)
Admission: EM | Disposition: A | Discharge status: Home / Self Care | Payer: Self-pay | Source: Home / Self Care | Attending: Cardiovascular Disease

## 2021-04-04 ENCOUNTER — Encounter (HOSPITAL_COMMUNITY): Payer: Self-pay | Admitting: Cardiovascular Disease

## 2021-04-04 ENCOUNTER — Other Ambulatory Visit (HOSPITAL_COMMUNITY): Payer: Self-pay

## 2021-04-04 DIAGNOSIS — I5043 Acute on chronic combined systolic (congestive) and diastolic (congestive) heart failure: Secondary | ICD-10-CM

## 2021-04-04 DIAGNOSIS — I25110 Atherosclerotic heart disease of native coronary artery with unstable angina pectoris: Secondary | ICD-10-CM

## 2021-04-04 DIAGNOSIS — I200 Unstable angina: Secondary | ICD-10-CM

## 2021-04-04 HISTORY — PX: CORONARY STENT INTERVENTION: CATH118234

## 2021-04-04 LAB — POCT ACTIVATED CLOTTING TIME: Activated Clotting Time: 407 seconds

## 2021-04-04 SURGERY — CORONARY STENT INTERVENTION
Anesthesia: LOCAL

## 2021-04-04 MED ORDER — SODIUM CHLORIDE 0.9 % IV SOLN: 250.0000 mL | INTRAVENOUS | Status: DC | PRN

## 2021-04-04 MED ORDER — ENOXAPARIN SODIUM 30 MG/0.3ML IJ SOSY
30.0000 mg | PREFILLED_SYRINGE | INTRAMUSCULAR | Status: DC
Start: 2021-04-05 — End: 2021-04-05
  Filled 2021-04-04: qty 0.3

## 2021-04-04 MED ORDER — HEPARIN (PORCINE) IN NACL 1000-0.9 UT/500ML-% IV SOLN
INTRAVENOUS | Status: AC
  Filled 2021-04-04: qty 1000

## 2021-04-04 MED ORDER — SODIUM CHLORIDE 0.9% FLUSH: 3.0000 mL | INTRAVENOUS | Status: DC | PRN

## 2021-04-04 MED ORDER — VERAPAMIL HCL 2.5 MG/ML IV SOLN
INTRAVENOUS | Status: DC | PRN
  Administered 2021-04-04: 08:00:00 10 mL via INTRA_ARTERIAL

## 2021-04-04 MED ORDER — VERAPAMIL HCL 2.5 MG/ML IV SOLN
INTRAVENOUS | Status: AC
  Filled 2021-04-04: qty 2

## 2021-04-04 MED ORDER — HEPARIN SODIUM (PORCINE) 1000 UNIT/ML IJ SOLN
INTRAMUSCULAR | Status: AC
  Filled 2021-04-04: qty 10

## 2021-04-04 MED ORDER — LIDOCAINE HCL (PF) 1 % IJ SOLN
INTRAMUSCULAR | Status: AC
  Filled 2021-04-04: qty 30

## 2021-04-04 MED ORDER — LABETALOL HCL 5 MG/ML IV SOLN: 10.0000 mg | INTRAVENOUS | Status: AC | PRN

## 2021-04-04 MED ORDER — SODIUM CHLORIDE 0.9 % IV SOLN: INTRAVENOUS | Status: AC

## 2021-04-04 MED ORDER — ASPIRIN 81 MG PO CHEW
81.0000 mg | CHEWABLE_TABLET | ORAL | Status: AC
  Administered 2021-04-04: 06:00:00 81 mg via ORAL
  Filled 2021-04-04: qty 1

## 2021-04-04 MED ORDER — SODIUM CHLORIDE 0.9% FLUSH
3.0000 mL | Freq: Two times a day (BID) | INTRAVENOUS | Status: DC
  Administered 2021-04-04 – 2021-04-05 (×2): 3 mL via INTRAVENOUS

## 2021-04-04 MED ORDER — SODIUM CHLORIDE 0.9% FLUSH: 3.0000 mL | Freq: Two times a day (BID) | INTRAVENOUS | Status: DC

## 2021-04-04 MED ORDER — ENOXAPARIN SODIUM 40 MG/0.4ML IJ SOSY: 40.0000 mg | PREFILLED_SYRINGE | INTRAMUSCULAR | Status: DC

## 2021-04-04 MED ORDER — HEPARIN (PORCINE) IN NACL 1000-0.9 UT/500ML-% IV SOLN
INTRAVENOUS | Status: DC | PRN
  Administered 2021-04-04 (×2): 500 mL

## 2021-04-04 MED ORDER — IOHEXOL 350 MG/ML SOLN
INTRAVENOUS | Status: DC | PRN
  Administered 2021-04-04: 09:00:00 55 mL

## 2021-04-04 MED ORDER — HEPARIN SODIUM (PORCINE) 1000 UNIT/ML IJ SOLN
INTRAMUSCULAR | Status: DC | PRN
  Administered 2021-04-04: 8000 [IU] via INTRAVENOUS

## 2021-04-04 MED ORDER — NITROGLYCERIN 1 MG/10 ML FOR IR/CATH LAB
INTRA_ARTERIAL | Status: AC
  Filled 2021-04-04: qty 10

## 2021-04-04 MED ORDER — HYDRALAZINE HCL 20 MG/ML IJ SOLN: 10.0000 mg | INTRAMUSCULAR | Status: AC | PRN

## 2021-04-04 MED ORDER — LIDOCAINE HCL (PF) 1 % IJ SOLN
INTRAMUSCULAR | Status: DC | PRN
  Administered 2021-04-04: 2 mL

## 2021-04-04 MED ORDER — SODIUM CHLORIDE 0.9 % IV SOLN: INTRAVENOUS | Status: DC

## 2021-04-04 MED FILL — Tirofiban HCl in NaCl 0.9% IV Soln 5 MG/100ML (Base Equiv): INTRAVENOUS | Qty: 100 | Status: AC

## 2021-04-04 SURGICAL SUPPLY — 17 items
BALLN SAPPHIRE 2.0X12 (BALLOONS) ×3
BALLN SAPPHIRE ~~LOC~~ 3.25X12 (BALLOONS) ×2 IMPLANT
BALLOON SAPPHIRE 2.0X12 (BALLOONS) IMPLANT
CATH VISTA GUIDE 6FR JR4 (CATHETERS) ×2 IMPLANT
DEVICE RAD COMP TR BAND LRG (VASCULAR PRODUCTS) ×2 IMPLANT
ELECT DEFIB PAD ADLT CADENCE (PAD) ×2 IMPLANT
GLIDESHEATH SLEND SS 6F .021 (SHEATH) ×2 IMPLANT
GUIDEWIRE INQWIRE 1.5J.035X260 (WIRE) IMPLANT
INQWIRE 1.5J .035X260CM (WIRE) ×3
KIT ENCORE 26 ADVANTAGE (KITS) ×2 IMPLANT
KIT HEART LEFT (KITS) ×3 IMPLANT
PACK CARDIAC CATHETERIZATION (CUSTOM PROCEDURE TRAY) ×3 IMPLANT
STENT ONYX FRONTIER 2.75X15 (Permanent Stent) ×2 IMPLANT
TRANSDUCER W/STOPCOCK (MISCELLANEOUS) ×3 IMPLANT
TUBING CIL FLEX 10 FLL-RA (TUBING) ×3 IMPLANT
WIRE COUGAR XT STRL 190CM (WIRE) ×2 IMPLANT
WIRE HI TORQ VERSACORE-J 145CM (WIRE) ×2 IMPLANT

## 2021-04-04 NOTE — Interval H&P Note (Signed)
History and Physical Interval Note:  04/04/2021 7:22 AM  Virginia Roberts  has presented today for surgery, with the diagnosis of cad.  The various methods of treatment have been discussed with the patient and family. After consideration of risks, benefits and other options for treatment, the patient has consented to  Procedure(s): CORONARY STENT INTERVENTION (N/A) as a surgical intervention.  The patient's history has been reviewed, patient examined, no change in status, stable for surgery.  I have reviewed the patient's chart and labs.  Questions were answered to the patient's satisfaction.    Cath Lab Visit (complete for each Cath Lab visit)  Clinical Evaluation Leading to the Procedure:   ACS: Yes.    Non-ACS:    Anginal Classification: CCS IV  Anti-ischemic medical therapy: Maximal Therapy (2 or more classes of medications)  Non-Invasive Test Results: No non-invasive testing performed  Prior CABG: No previous CABG  Staged PCI or RCA. She presented with a stemi last week secondary to occluded Circumflex        Verne Carrow

## 2021-04-04 NOTE — Progress Notes (Signed)
CARDIAC REHAB PHASE I   Went to begin MI/stent education with pt. Pt resting, will not disturb at this time. Materials left at bedside. Will continue to follow.  [id number] Reynold Bowen, RN BSN 04/04/2021 2:54 PM

## 2021-04-04 NOTE — Progress Notes (Signed)
Updated daughter after PCI.

## 2021-04-04 NOTE — TOC Benefit Eligibility Note (Addendum)
Patient Product/process development scientist completed.    The patient is currently admitted and upon discharge could be taking Entresto 24-26 mg.  The current 30 day co-pay is, $47.00.  The patient is currently admitted and upon discharge could be taking Farxiga 10 mg.  The current 30 day co-pay is, $47.00.   The patient is currently admitted and upon discharge could be taking Jardiance 10 mg.  The current 30 day co-pay is, $47.00.  The patient is currently admitted and upon discharge could be taking Brilinta 90 mg.  The current 30 day co-pay is, $47.00.  The patient is insured through [employer]     Roland Earl, CPhT Pharmacy Patient Advocate Specialist [facility] Patient Advocate Team Direct Number: [phone]  Fax: [phone]

## 2021-04-04 NOTE — Progress Notes (Signed)
Progress Note  Patient Name: Virginia Roberts Date of Encounter: 04/04/2021  [facility] Cardiologist: Dr. Tonny Bollman  Subjective   Patient back from Cath Lab this morning where she had staged RCA intervention by Dr. Clifton James.  She denies chest pain or shortness of breath.  Inpatient Medications    Scheduled Meds:  aspirin EC  81 mg Oral Daily   atorvastatin  80 mg Oral Daily   Chlorhexidine Gluconate Cloth  6 each Topical Daily   [START ON 04/05/2021] enoxaparin (LOVENOX) injection  30 mg Subcutaneous Q24H   metoprolol tartrate  12.5 mg Oral BID   oxybutynin  10 mg Oral QHS   sodium chloride flush  3 mL Intravenous Q12H   sodium chloride flush  3 mL Intravenous Q12H   ticagrelor  90 mg Oral BID   Continuous Infusions:  sodium chloride     sodium chloride     sodium chloride 50 mL/hr at 04/04/21 0922   sodium chloride     dexmedetomidine (PRECEDEX) IV infusion Stopped (04/03/21 0936)   PRN Meds: sodium chloride, sodium chloride, acetaminophen, haloperidol lactate, hydrALAZINE, labetalol, nitroGLYCERIN, ondansetron (ZOFRAN) IV, sodium chloride flush, sodium chloride flush   Vital Signs    Vitals:   04/04/21 0818 04/04/21 0823 04/04/21 0828 04/04/21 0833  BP: 131/79 136/74 135/78 130/81  Pulse: 81 82 71 90  Resp: 17 17 15 17   Temp:      TempSrc:      SpO2: 98% 96% 95% 95%  Weight:      Height:        Intake/Output Summary (Last 24 hours) at 04/04/2021 1126 Last data filed at 04/04/2021 0700 Gross per 24 hour  Intake 301.97 ml  Output 665 ml  Net -363.03 ml   Last 3 Weights 04/01/2021 02/11/2014  Weight (lbs) 115 lb 136 lb 1.6 oz  Weight (kg) 52.164 kg 61.735 kg      Telemetry    Normal sinus rhythm- Personally Reviewed  ECG    Normal sinus rhythm at 81 with inferolateral Q waves and anterior ST segment depression.- Personally Reviewed  Physical Exam   GEN: No acute distress.   Neck: No JVD Cardiac: RRR, no murmurs, rubs, or gallops.   Respiratory: Clear to auscultation bilaterally. GI: Soft, nontender, non-distended  MS: No edema; No deformity. Neuro:  Nonfocal  Psych: Normal affect   Labs    High Sensitivity Troponin:   Recent Labs  Lab 04/01/21 1342 04/01/21 1650  TROPONINIHS 95* 19,301*     Chemistry Recent Labs  Lab 04/01/21 1342 04/02/21 0053 04/02/21 0243 04/03/21 0243  NA 137 138 137 137  K 3.2* 3.5 3.5 3.5  CL 104  --  100 100  CO2 25  --  26 29  GLUCOSE 132*  --  158* 135*  BUN 14  --  12 17  CREATININE 0.81  --  0.94 1.08*  CALCIUM 8.7*  --  8.9 8.5*  MG 1.7  --  2.3 1.9  PROT 5.7*  --   --   --   ALBUMIN 3.7  --   --   --   AST 34  --   --   --   ALT 19  --   --   --   ALKPHOS 50  --   --   --   BILITOT 1.2  --   --   --   GFRNONAA >60  --  >60 52*  ANIONGAP 8  --  11 8  Lipids  Recent Labs  Lab 04/02/21 0243  CHOL 188  TRIG 107  HDL 75  LDLCALC 92  CHOLHDL 2.5    Hematology Recent Labs  Lab 04/01/21 1342 04/02/21 0053 04/02/21 0243 04/03/21 0243  WBC 6.8  --  8.6 9.6  RBC 3.78*  --  4.33 3.77*  HGB 12.1 14.3 14.3 12.2  HCT 37.0 42.0 42.2 37.1  MCV 97.9  --  97.5 98.4  MCH 32.0  --  33.0 32.4  MCHC 32.7  --  33.9 32.9  RDW 12.8  --  12.9 12.9  PLT 173  --  198 147*   Thyroid  Recent Labs  Lab 04/01/21 1650  TSH 1.384  FREET4 1.44*    BNP Recent Labs  Lab 04/01/21 1351  BNP 982.3*    DDimer No results for input(s): DDIMER in the last 168 hours.   Radiology    CARDIAC CATHETERIZATION  Result Date: 04/04/2021   Mid RCA lesion is 75% stenosed.   A drug-eluting stent was successfully placed using a STENT ONYX FRONTIER 2.75X15.   Post intervention, there is a 0% residual stenosis. Severe mid RCA stenosis Successful PTCA/DES x 1 mid RCA Recommendations: DAPT with ASA/Brilinta for 12 months if tolerated. Continue beta blocker and statin.    Cardiac Studies   2D echocardiogram (04/01/2021)  IMPRESSIONS     1. Left ventricular ejection fraction,  by estimation, is 45 to 50%. The  left ventricle has mildly decreased function. The left ventricle  demonstrates global hypokinesis. There is mild left ventricular  hypertrophy of the basal segment.   2. Right ventricular systolic function is normal. The right ventricular  size is moderately enlarged. There is severely elevated pulmonary artery  systolic pressure.   3. Left atrial size was severely dilated.   4. Right atrial size was severely dilated.   5. Severe mitral valve regurgitation.   6. Tricuspid valve regurgitation is severe.   7. Aortic valve regurgitation is moderate to severe.   8. Pulmonic valve regurgitation is moderate to severe.   Cardiac catheterization/PCI circumflex 04/01/2021 Conclusion      Mid RCA lesion is 75% stenosed.   Mid Cx lesion is 100% stenosed.   2nd Mrg lesion is 100% stenosed.   Prox LAD to Mid LAD lesion is 40% stenosed.   A drug-eluting stent was successfully placed using a STENT ONYX FRONTIER 3.0X26.   Post intervention, there is a 0% residual stenosis.   Acute inferoposterior STEMI secondary to occlusion of the mid circumflex, treated successfully with primary PCI using a 3.0x26 mm Onyx Frontier DES. 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   PCI drug-eluting stent RCA 04/04/2021  Coronary Diagrams  Diagnostic Dominance: Right Intervention   Conclusion      Mid RCA lesion is 75% stenosed.   A drug-eluting stent was successfully placed using a STENT ONYX FRONTIER 2.75X15.   Post intervention, there is a 0% residual stenosis.   Severe mid RCA stenosis SuccCoronary Diagrams  Diagnostic Dominance: Right Intervention  essful PTCA/DES x 1 mid RCA  Patient Profile      80 y.o. female HTN and remote history of DVT who presented with chest pain found to have STE in inferior leads consistent with STEMI. Cath with 100% mid-Lcx and Om2 s/p  PCI to the mid-Lcx with distal embolization to OM sub-branch. Also with 75% RCA who just returned from staged RCA PCI by Dr. Clifton James. Assessment & Plan    1: Inferolateral STEMI-cath by Dr. Excell Seltzer revealed occluded circumflex status post PCI and stenting.  He went right radial.  There was significant tortuosity requiring "a long sheath".  Patient did have a right radial hematoma.  She is on DAPT with aspirin and Brilinta.  She had staged intervention of her mid RCA by Dr. Clifton James this morning via left radial approach.  2: Systolic heart failure-EF 45 to 50% by transthoracic echo elevated LVEDP.  She did diurese.  Continue on guideline directed optimal medical therapy including beta-blocker, Entresto, spironolactone and Jardiance/Farxiga.  Her I's and O's are -5.5 L.  Her diuretics are currently on hold.  3: Severe valvular heart disease-2D echo shows severe MR, TR, PI.  At this point, we will continue to follow.  4: Hyperlipidemia-LDL less than 100.  She is on statin therapy.  We will plan to keep her in the intensive care unit today with transfer to telemetry tomorrow, ambulation cardiac rehab and optimization of her medical therapy with intent to discharge on Wednesday.  For questions or updates, please contact [facility] Please consult [email or web address] for contact info under        Signed, Nanetta Batty, MD  04/04/2021, 11:26 AM

## 2021-04-05 ENCOUNTER — Other Ambulatory Visit (HOSPITAL_COMMUNITY): Payer: Self-pay

## 2021-04-05 LAB — URINALYSIS, ROUTINE W REFLEX MICROSCOPIC
Bilirubin Urine: NEGATIVE
Glucose, UA: NEGATIVE mg/dL
Ketones, ur: 5 mg/dL — AB
Nitrite: NEGATIVE
Protein, ur: 100 mg/dL — AB
RBC / HPF: >50 RBC/hpf — ABNORMAL HIGH (ref 0–5)
Specific Gravity, Urine: 1.021 (ref 1.005–1.030)
WBC, UA: >50 WBC/hpf — ABNORMAL HIGH (ref 0–5)
pH: 5 (ref 5.0–8.0)

## 2021-04-05 LAB — MAGNESIUM: Magnesium: 1.9 mg/dL (ref 1.7–2.4)

## 2021-04-05 LAB — BASIC METABOLIC PANEL
Anion gap: 10 (ref 5–15)
BUN: 18 mg/dL (ref 8–23)
CO2: 25 mmol/L (ref 22–32)
Calcium: 8.2 mg/dL — ABNORMAL LOW (ref 8.9–10.3)
Chloride: 98 mmol/L (ref 98–111)
Creatinine, Ser: 0.96 mg/dL (ref 0.44–1.00)
GFR, Estimated: >60 mL/min (ref >60)
Glucose, Bld: 113 mg/dL — ABNORMAL HIGH (ref 70–99)
Potassium: 2.9 mmol/L — ABNORMAL LOW (ref 3.5–5.1)
Sodium: 133 mmol/L — ABNORMAL LOW (ref 135–145)

## 2021-04-05 MED ORDER — POTASSIUM CHLORIDE CRYS ER 20 MEQ PO TBCR
30.0000 meq | EXTENDED_RELEASE_TABLET | ORAL | Status: AC
  Administered 2021-04-05 (×2): 30 meq via ORAL
  Filled 2021-04-05 (×2): qty 1

## 2021-04-05 MED ORDER — ENOXAPARIN SODIUM 30 MG/0.3ML IJ SOSY
30.0000 mg | PREFILLED_SYRINGE | INTRAMUSCULAR | Status: DC
Start: 2021-04-05 — End: 2021-04-05
  Administered 2021-04-05: 11:00:00 30 mg via SUBCUTANEOUS

## 2021-04-05 MED ORDER — BOOST / RESOURCE BREEZE PO LIQD CUSTOM
1.0000 | Freq: Three times a day (TID) | ORAL | Status: DC
  Administered 2021-04-05 – 2021-04-08 (×5): 1 via ORAL

## 2021-04-05 MED ORDER — TRAZODONE HCL 50 MG PO TABS
50.0000 mg | ORAL_TABLET | Freq: Every evening | ORAL | Status: DC | PRN
  Administered 2021-04-05 – 2021-04-07 (×3): 50 mg via ORAL
  Filled 2021-04-05 (×3): qty 1

## 2021-04-05 MED ORDER — MELATONIN 5 MG PO TABS
5.0000 mg | ORAL_TABLET | Freq: Every day | ORAL | Status: DC
  Administered 2021-04-05 – 2021-04-07 (×3): 5 mg via ORAL
  Filled 2021-04-05 (×3): qty 1

## 2021-04-05 MED ORDER — LIP MEDEX EX OINT
TOPICAL_OINTMENT | CUTANEOUS | Status: DC | PRN
  Filled 2021-04-05: qty 7

## 2021-04-05 MED ORDER — SPIRONOLACTONE 12.5 MG HALF TABLET
12.5000 mg | ORAL_TABLET | Freq: Every day | ORAL | Status: DC
  Administered 2021-04-05 – 2021-04-08 (×4): 12.5 mg via ORAL
  Filled 2021-04-05 (×4): qty 1

## 2021-04-05 MED ORDER — METOPROLOL SUCCINATE ER 50 MG PO TB24
50.0000 mg | ORAL_TABLET | Freq: Every day | ORAL | Status: DC
  Administered 2021-04-05 – 2021-04-08 (×4): 50 mg via ORAL
  Filled 2021-04-05 (×4): qty 1

## 2021-04-05 MED ORDER — HYDROXYZINE HCL 25 MG PO TABS
25.0000 mg | ORAL_TABLET | Freq: Three times a day (TID) | ORAL | Status: DC | PRN
  Administered 2021-04-05 – 2021-04-07 (×2): 25 mg via ORAL
  Filled 2021-04-05 (×2): qty 1

## 2021-04-05 MED ORDER — ENOXAPARIN SODIUM 40 MG/0.4ML IJ SOSY
40.0000 mg | PREFILLED_SYRINGE | INTRAMUSCULAR | Status: DC
Start: 2021-04-06 — End: 2021-04-05

## 2021-04-05 MED ORDER — ADULT MULTIVITAMIN W/MINERALS CH
1.0000 | ORAL_TABLET | Freq: Every day | ORAL | Status: DC
  Administered 2021-04-05 – 2021-04-08 (×3): 1 via ORAL
  Filled 2021-04-05 (×4): qty 1

## 2021-04-05 MED ORDER — SALINE SPRAY 0.65 % NA SOLN
1.0000 | NASAL | Status: DC | PRN
  Administered 2021-04-05: 11:00:00 1 via NASAL
  Filled 2021-04-05: qty 44

## 2021-04-05 MED ORDER — MENTHOL 3 MG MT LOZG
1.0000 | LOZENGE | Freq: Once | OROMUCOSAL | Status: DC
  Filled 2021-04-05: qty 9

## 2021-04-05 MED ORDER — MELATONIN 5 MG PO TABS: 5.0000 mg | ORAL_TABLET | Freq: Every day | ORAL | Status: DC

## 2021-04-05 MED ORDER — MAGNESIUM SULFATE IN D5W 1-5 GM/100ML-% IV SOLN
1.0000 g | Freq: Once | INTRAVENOUS | Status: AC
  Administered 2021-04-05: 14:00:00 1 g via INTRAVENOUS
  Filled 2021-04-05: qty 100

## 2021-04-05 MED ORDER — ENOXAPARIN SODIUM 30 MG/0.3ML IJ SOSY
30.0000 mg | PREFILLED_SYRINGE | INTRAMUSCULAR | Status: DC
  Administered 2021-04-06 – 2021-04-08 (×3): 30 mg via SUBCUTANEOUS
  Filled 2021-04-05 (×3): qty 0.3

## 2021-04-05 NOTE — Progress Notes (Signed)
Pts son and daughter at bedside, updated about pts status.

## 2021-04-05 NOTE — Progress Notes (Signed)
Family spoke with this RN on phone, daughter stated that pt has been asking her "the same thing over and over again, but not a lot". Pts niece updated about pts status. Family called back and expressed concern about pts confusion and asked if pt could have a UA run to check for UTI, they also wanted a CT or MRI of the head for stroke. Dr. Allyson Sabal informed and order for UA placed.

## 2021-04-05 NOTE — Progress Notes (Signed)
Progress Note  Patient Name: Emmalina Espericueta Date of Encounter: 04/05/2021  [facility] Cardiologist: Dr. Tonny Bollman  Subjective   Patient successfully had staged mid RCA PCI and stenting by Dr. Clifton James via the left radial approach yesterday.  She has had "some sundowning" last night and some agitation.  She denies chest pain or shortness of breath.  Inpatient Medications    Scheduled Meds:  aspirin EC  81 mg Oral Daily   atorvastatin  80 mg Oral Daily   Chlorhexidine Gluconate Cloth  6 each Topical Daily   enoxaparin (LOVENOX) injection  30 mg Subcutaneous Q24H   metoprolol tartrate  12.5 mg Oral BID   oxybutynin  10 mg Oral QHS   sodium chloride flush  3 mL Intravenous Q12H   sodium chloride flush  3 mL Intravenous Q12H   ticagrelor  90 mg Oral BID   Continuous Infusions:  sodium chloride     sodium chloride     sodium chloride     dexmedetomidine (PRECEDEX) IV infusion 0.4 mcg/kg/hr (04/05/21 0148)   PRN Meds: sodium chloride, sodium chloride, acetaminophen, haloperidol lactate, nitroGLYCERIN, ondansetron (ZOFRAN) IV, sodium chloride flush, sodium chloride flush   Vital Signs    Vitals:   04/05/21 0400 04/05/21 0500 04/05/21 0600 04/05/21 0813  BP: 125/84  124/63   Pulse: 84 68 65   Resp: (!) 25 16 16    Temp: (!) 97.4 F (36.3 C)   97.6 F (36.4 C)  TempSrc: Oral   Oral  SpO2: 90% 94% 94%   Weight:      Height:        Intake/Output Summary (Last 24 hours) at 04/05/2021 0828 Last data filed at 04/05/2021 0000 Gross per 24 hour  Intake 240 ml  Output 845 ml  Net -605 ml   Last 3 Weights 04/01/2021 02/11/2014  Weight (lbs) 115 lb 136 lb 1.6 oz  Weight (kg) 52.164 kg 61.735 kg      Telemetry    Normal sinus rhythm- Personally Reviewed  ECG    Normal sinus rhythm at 69 with bifascicular block and inferior Q waves.- Personally Reviewed  Physical Exam   GEN: No acute distress.   Neck: No JVD Cardiac: RRR, no murmurs, rubs, or gallops.   Respiratory: Clear to auscultation bilaterally. GI: Soft, nontender, non-distended  MS: No edema; No deformity. Neuro:  Nonfocal  Psych: Normal affect   Labs    High Sensitivity Troponin:   Recent Labs  Lab 04/01/21 1342 04/01/21 1650  TROPONINIHS 95* 19,301*     Chemistry Recent Labs  Lab 04/01/21 1342 04/02/21 0053 04/02/21 0243 04/03/21 0243  NA 137 138 137 137  K 3.2* 3.5 3.5 3.5  CL 104  --  100 100  CO2 25  --  26 29  GLUCOSE 132*  --  158* 135*  BUN 14  --  12 17  CREATININE 0.81  --  0.94 1.08*  CALCIUM 8.7*  --  8.9 8.5*  MG 1.7  --  2.3 1.9  PROT 5.7*  --   --   --   ALBUMIN 3.7  --   --   --   AST 34  --   --   --   ALT 19  --   --   --   ALKPHOS 50  --   --   --   BILITOT 1.2  --   --   --   GFRNONAA >60  --  >60 52*  ANIONGAP  8  --  11 8    Lipids  Recent Labs  Lab 04/02/21 0243  CHOL 188  TRIG 107  HDL 75  LDLCALC 92  CHOLHDL 2.5    Hematology Recent Labs  Lab 04/01/21 1342 04/02/21 0053 04/02/21 0243 04/03/21 0243  WBC 6.8  --  8.6 9.6  RBC 3.78*  --  4.33 3.77*  HGB 12.1 14.3 14.3 12.2  HCT 37.0 42.0 42.2 37.1  MCV 97.9  --  97.5 98.4  MCH 32.0  --  33.0 32.4  MCHC 32.7  --  33.9 32.9  RDW 12.8  --  12.9 12.9  PLT 173  --  198 147*   Thyroid  Recent Labs  Lab 04/01/21 1650  TSH 1.384  FREET4 1.44*    BNP Recent Labs  Lab 04/01/21 1351  BNP 982.3*    DDimer No results for input(s): DDIMER in the last 168 hours.   Radiology    CARDIAC CATHETERIZATION  Result Date: 04/04/2021   Mid RCA lesion is 75% stenosed.   A drug-eluting stent was successfully placed using a STENT ONYX FRONTIER 2.75X15.   Post intervention, there is a 0% residual stenosis. Severe mid RCA stenosis Successful PTCA/DES x 1 mid RCA Recommendations: DAPT with ASA/Brilinta for 12 months if tolerated. Continue beta blocker and statin.    Cardiac Studies   2D echocardiogram (04/01/2021)  IMPRESSIONS     1. Left ventricular ejection fraction,  by estimation, is 45 to 50%. The  left ventricle has mildly decreased function. The left ventricle  demonstrates global hypokinesis. There is mild left ventricular  hypertrophy of the basal segment.   2. Right ventricular systolic function is normal. The right ventricular  size is moderately enlarged. There is severely elevated pulmonary artery  systolic pressure.   3. Left atrial size was severely dilated.   4. Right atrial size was severely dilated.   5. Severe mitral valve regurgitation.   6. Tricuspid valve regurgitation is severe.   7. Aortic valve regurgitation is moderate to severe.   8. Pulmonic valve regurgitation is moderate to severe.   Cardiac catheterization/PCI circumflex 04/01/2021 Conclusion      Mid RCA lesion is 75% stenosed.   Mid Cx lesion is 100% stenosed.   2nd Mrg lesion is 100% stenosed.   Prox LAD to Mid LAD lesion is 40% stenosed.   A drug-eluting stent was successfully placed using a STENT ONYX FRONTIER 3.0X26.   Post intervention, there is a 0% residual stenosis.   Acute inferoposterior STEMI secondary to occlusion of the mid circumflex, treated successfully with primary PCI using a 3.0x26 mm Onyx Frontier DES. 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   PCI drug-eluting stent RCA 04/04/2021  Coronary Diagrams  Diagnostic Dominance: Right Intervention   Conclusion      Mid RCA lesion is 75% stenosed.   A drug-eluting stent was successfully placed using a STENT ONYX FRONTIER 2.75X15.   Post intervention, there is a 0% residual stenosis.   Severe mid RCA stenosis SuccCoronary Diagrams  Diagnostic Dominance: Right Intervention  essful PTCA/DES x 1 mid RCA  Patient Profile      80 y.o. female HTN and remote history of DVT who presented with chest pain found to have STE in inferior leads consistent  with STEMI. Cath with 100% mid-Lcx and Om2 s/p PCI to the mid-Lcx with distal embolization to OM sub-branch.  She had successful staged mid RCA PCI and stenting via the left radial approach by Dr. Clifton James yesterday.  Assessment & Plan    1: Inferolateral STEMI-cath by Dr. Excell Seltzer revealed occluded circumflex status post PCI and stenting.  He went right radial.  There was significant tortuosity requiring "a long sheath".  Patient did have a right radial hematoma.  She is on DAPT with aspirin and Brilinta.  She had staged intervention of her mid RCA by Dr. Clifton James yesterday morning via the left radial approach.  She currently denies chest pain or shortness of breath.  2: Systolic heart failure-EF 45 to 50% by transthoracic echo elevated LVEDP.  She did diurese.  Continue on guideline directed optimal medical therapy including beta-blocker, Entresto, spironolactone and Jardiance/Farxiga.  Her I's and O's are - 6.0 L.  Her diuretics are currently on hold.  3: Severe valvular heart disease-2D echo shows severe MR, TR, PI.  At this point, we will continue to follow.  4: Hyperlipidemia-LDL less than 100.  She is on statin therapy.  We will plan to transfer to telemetry today.  We will get cardiac rehab to ambulate.  We will gently initiate goal-directed optimal medical therapy for LV dysfunction.  I am going to hold off on restarting diuretics other than spironolactone low-dose.  For questions or updates, please contact [facility] Please consult [email or web address] for contact info under        Signed, Nanetta Batty, MD  04/05/2021, 8:28 AM

## 2021-04-05 NOTE — Progress Notes (Signed)
[facility] visited pt. per [facility] consult for AD education; pt. lying in bed, eyes closed but awake when her name was called.  Pt. says she came to the hospital after suffering a heart attack; her husband has been ill due to complications related to his diabetes and pt. says she has been overexerting herself in serving as caregiver.  Pt. is deeply connected to faith and faith community, sharing "I'd be nowhere without my church... and without Him [pointing upward]."  Pt. grateful for visit and requested prayer.  At end of visit pt. shared that she has already completed AD but does not have it with her; she says her husband and children would be her medical decisionmakers, which seems in line with [place] medical surrogacy laws --> no apparent need to clarify medical decisionmaker, although chaplains remain available via consult/page if further discussion/clarification is needed.  Elpidio Anis, Chaplain Pager: [phone]

## 2021-04-05 NOTE — Progress Notes (Signed)
Initial Nutrition Assessment  DOCUMENTATION CODES:   Not applicable  INTERVENTION:   Boost Breeze po TID, each supplement provides 250 kcal and 9 grams of protein  Add MVI with Minerals   NUTRITION DIAGNOSIS:   Inadequate oral intake related to poor appetite, acute illness as evidenced by per patient/family report, meal completion < 50%.   GOAL:   Patient will meet greater than or equal to 90% of their needs   MONITOR:   PO intake, Supplement acceptance, Labs, Weight trends  REASON FOR ASSESSMENT:   Rounds    ASSESSMENT:   80 yo female admitted with STEMI. PMH includes CHF, severe valvular heart disease and HLD.  12/19 Mid RCA PCI and stenting  Pt speech is tangential, hard to obtain much diet and weight history at this time.   RN reports pt has not been eating well, pt relates this to her diverticulosis Pt reports poor appetite but did order breakfast this AM and felt this was an improvement. Pt ate 1 saltine cracker, bite of grits and some juice and milk.   Pt was not interested in the milky oral nutrition supplements but agreeable to trying Boost Breeze.   Labs: sodium 133 (L), potassium 2.9 (L) Meds: KCl  Diet Order:   Diet Order             Diet Heart Room service appropriate? Yes; Fluid consistency: Thin  Diet effective now                   EDUCATION NEEDS:   Not appropriate for education at this time  Skin:  Skin Assessment: Reviewed RN Assessment  Last BM:  12/20  Height:   Ht Readings from Last 1 Encounters:  04/01/21 5' (1.524 m)    Weight:   Wt Readings from Last 1 Encounters:  04/01/21 52.2 kg     BMI:  Body mass index is 22.46 kg/m.  Estimated Nutritional Needs:   Kcal:  1560-1720 kcals  Protein:  75-85 g  Fluid:  >/= 1.5 L   Romelle Starcher MS, RDN, LDN, CNSC Registered Dietitian III Clinical Nutrition RD Pager and On-Call Pager Number Located in [place]

## 2021-04-05 NOTE — Progress Notes (Signed)
Throughout shift pt difficult to re-direct, pt not eating or drinking well. Pt refused attempts by this RN to have pt take PO water, pt attempted to throw water at staff. Pt aggressive and difficult to work. Pt stating throughout the day that she cannot drink water because she has diverticulitis, this RN informed pt repeatedly that this has nothing to do with diverticulitis.

## 2021-04-05 NOTE — Progress Notes (Signed)
CARDIAC REHAB PHASE I   PRE:  Rate/Rhythm: 87 SR  BP:  Sitting: 109/70      SaO2: 94 RA  MODE:  Ambulation: 170 ft   POST:  Rate/Rhythm: 87 SR  BP:  Sitting: 117/72    SaO2: 93 RA   Pt assisted to brush teeth and hair. Pt than ambulated 169ft in hallway assist of one with front wheel walker and gait belt. Pt returned to recliner. Chair alarm placed. Encouraged med compliance. Pt states she normally helps care for her husband. Pt needs safe d/c plan. Will refer to CRP [facility] [place] to meet the requirement.   [phone] Reynold Bowen, RN BSN 04/05/2021 10:12 AM

## 2021-04-05 NOTE — Evaluation (Signed)
Physical Therapy Evaluation Patient Details Name: Virginia Roberts MRN: [id number] DOB: [birth year] Today's Date: 04/05/2021  History of Present Illness  Pt is a 80 y.o. F who presents 04/05/2021 with STEMI now s/p PCI. Significant PMH: DVT, HLD, systolic heart failure.  Clinical Impression  Pt overall is mobilizing well, albeit mildly unsteady, however cognition has declined while inpatient to the point she now has a Recruitment consultant. Pt not oriented to place and demonstrates poor awareness, attention and memory. Pt ambulating limited hallway distances with a walker at a min assist level. Needs frequent redirection for environmental negotiation and to avoid running into objects. She presents as a high fall risk. Due to lack of 24/7 assist upon d/c home, currently recommending SNF. If pt did have adequate family supervision for all mobility and ADL's, she could potentially discharge home.       Recommendations for follow up therapy are one component of a multi-disciplinary discharge planning process, led by the attending physician.  Recommendations may be updated based on patient status, additional functional criteria and insurance authorization.  Follow Up Recommendations Skilled nursing-short term rehab (<3 hours/day)    Assistance Recommended at Discharge Frequent or constant Supervision/Assistance  Functional Status Assessment Patient has had a recent decline in their functional status and demonstrates the ability to make significant improvements in function in a reasonable and predictable amount of time.   Equipment Recommendations  None recommended by PT    Recommendations for Other Services       Precautions / Restrictions Precautions Precautions: Fall Restrictions Weight Bearing Restrictions: No      Mobility  Bed Mobility               General bed mobility comments: OOB in chair    Transfers Overall transfer level: Needs assistance Equipment used: Rolling walker (2  wheels) Transfers: Sit to/from Stand Sit to Stand: Supervision                Ambulation/Gait Ambulation/Gait assistance: Min assist Gait Distance (Feet): 250 Feet Assistive device: Rolling walker (2 wheels) Gait Pattern/deviations: Step-through pattern;Decreased stride length       General Gait Details: Cues for obstacle negotiation, keeping walker on ground, pt requiring minA for balance. frequently running into objects  Stairs            Wheelchair Mobility    Modified Rankin (Stroke Patients Only)       Balance Overall balance assessment: Mild deficits observed, not formally tested                                           Pertinent Vitals/Pain Pain Assessment: No/denies pain    Home Living Family/patient expects to be discharged to:: Private residence Living Arrangements: Alone Available Help at Discharge: Family;Available PRN/intermittently Type of Home: House Home Access: Stairs to enter   Entrance Stairs-Number of Steps: 1   Home Layout: One level Home Equipment: Rollator (4 wheels);Cane - single point Additional Comments: Husband,Frank, has been at [facility] for x 2 weeks. Previously, she was his caregiver    Prior Function Prior Level of Function : Independent/Modified Independent                     Hand Dominance        Extremity/Trunk Assessment   Upper Extremity Assessment Upper Extremity Assessment: Overall WFL for tasks assessed    Lower  Extremity Assessment Lower Extremity Assessment: Overall WFL for tasks assessed       Communication   Communication: No difficulties  Cognition Arousal/Alertness: Awake/alert Behavior During Therapy: Impulsive Overall Cognitive Status: Impaired/Different from baseline Area of Impairment: Orientation;Attention;Memory;Following commands;Safety/judgement;Awareness;Problem solving                 Orientation Level: Disoriented to;Place Current Attention Level:  Sustained Memory: Decreased short-term memory Following Commands: Follows one step commands inconsistently Safety/Judgement: Decreased awareness of safety;Decreased awareness of deficits Awareness: Intellectual Problem Solving: Difficulty sequencing;Requires verbal cues General Comments: Pt initially oriented to [facility], then stating we were in [place]. Pt stating "Homero Fellers," was coming home tomorrow, although he is not. Pt also stated her daughter Kriste Basque was there earlier, which she was not. Pt running into obstacles in hallway with no awareness, could not way find or problem solve how to return to her room, thought we came from outside        General Comments General comments (skin integrity, edema, etc.): VSS    Exercises     Assessment/Plan    PT Assessment Patient needs continued PT services  PT Problem List Decreased balance;Decreased mobility;Decreased cognition;Decreased safety awareness       PT Treatment Interventions DME instruction;Gait training;Therapeutic activities;Therapeutic exercise;Balance training;Functional mobility training;Patient/family education    PT Goals (Current goals can be found in the Care Plan section)  Acute Rehab PT Goals Patient Stated Goal: see her family PT Goal Formulation: With patient Time For Goal Achievement: 04/19/21 Potential to Achieve Goals: Good    Frequency Min 2X/week   Barriers to discharge Decreased caregiver support      Co-evaluation               AM-PAC PT "6 Clicks" Mobility  Outcome Measure Help needed turning from your back to your side while in a flat bed without using bedrails?: None Help needed moving from lying on your back to sitting on the side of a flat bed without using bedrails?: None Help needed moving to and from a bed to a chair (including a wheelchair)?: A Little Help needed standing up from a chair using your arms (e.g., wheelchair or bedside chair)?: A Little Help needed to walk in hospital  room?: A Little Help needed climbing 3-5 steps with a railing? : A Little 6 Click Score: 20    End of Session Equipment Utilized During Treatment: Gait belt Activity Tolerance: Patient tolerated treatment well Patient left: in chair;with call bell/phone within reach;with chair alarm set;with nursing/sitter in room Nurse Communication: Mobility status PT Visit Diagnosis: Unsteadiness on feet (R26.81)    Time: 4270-6237 PT Time Calculation (min) (ACUTE ONLY): 28 min   Charges:   PT Evaluation $PT Eval Moderate Complexity: 1 Mod PT Treatments $Therapeutic Activity: 8-22 mins        Lillia Pauls, PT, DPT Acute Rehabilitation Services Pager [phone] Office [phone]   Norval Morton 04/05/2021, 5:22 PM

## 2021-04-06 LAB — BASIC METABOLIC PANEL
Anion gap: 10 (ref 5–15)
BUN: 19 mg/dL (ref 8–23)
CO2: 25 mmol/L (ref 22–32)
Calcium: 8.9 mg/dL (ref 8.9–10.3)
Chloride: 99 mmol/L (ref 98–111)
Creatinine, Ser: 0.96 mg/dL (ref 0.44–1.00)
GFR, Estimated: >60 mL/min (ref >60)
Glucose, Bld: 134 mg/dL — ABNORMAL HIGH (ref 70–99)
Potassium: 3.6 mmol/L (ref 3.5–5.1)
Sodium: 134 mmol/L — ABNORMAL LOW (ref 135–145)

## 2021-04-06 MED ORDER — SODIUM CHLORIDE 0.9 % IV SOLN
1.0000 g | INTRAVENOUS | Status: DC
  Administered 2021-04-06 – 2021-04-08 (×3): 1 g via INTRAVENOUS
  Filled 2021-04-06 (×3): qty 10

## 2021-04-06 MED ORDER — SODIUM CHLORIDE 0.9 % IV SOLN: 1.0000 g | INTRAVENOUS | Status: DC

## 2021-04-06 NOTE — NC FL2 (Addendum)
[place] MEDICAID FL2 LEVEL OF CARE SCREENING TOOL     IDENTIFICATION  Patient Name: Virginia Roberts Birthdate: Nov 03, 1940 Sex: female Admission Date (Current Location): 04/01/2021  [facility] and [place] Number:  Producer, television/film/video and Address:  [facility], [address], [place], [place] [address]      Provider Number: [id number]  Attending Physician Name and Address:  Tonny Bollman, MD  Relative Name and Phone Number:  CERISSA, ZEIGER)   [phone] [facility])    Current Level of Care: Hospital Recommended Level of Care: Skilled Nursing Facility Prior Approval Number:    Date Approved/Denied:   PASRR Number: pending  Discharge Plan: SNF    Current Diagnoses: Patient Active Problem List   Diagnosis Date Noted   Unstable angina [facility])    STEMI involving oth coronary artery of inferior wall (HCC) 04/01/2021   STEMI (ST elevation myocardial infarction) (HCC) 04/01/2021   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    Orientation RESPIRATION BLADDER Height & Weight     Self  Normal Indwelling catheter Weight: 115 lb (52.2 kg) (Patient stated weight) Height:  5' (152.4 cm)  BEHAVIORAL SYMPTOMS/MOOD NEUROLOGICAL BOWEL NUTRITION STATUS      Continent Diet (see d/c summary)  AMBULATORY STATUS COMMUNICATION OF NEEDS Skin   Limited Assist Verbally Normal                       Personal Care Assistance Level of Assistance  Bathing, Feeding, Dressing Bathing Assistance: Limited assistance Feeding assistance: Independent Dressing Assistance: Limited assistance     Functional Limitations Info  Sight, Hearing, Speech Sight Info: Impaired Hearing Info: Impaired Speech Info: Adequate    SPECIAL CARE FACTORS FREQUENCY  PT (By licensed PT), OT (By licensed OT)     PT Frequency: 5x/week OT Frequency: 5x/week            Contractures Contractures Info: Not present    Additional Factors Info  Code Status,  Allergies Code Status Info: Full code Allergies Info: Cantil (mepenzolate), metoprolol, Diltiazem, Erythromycin, Versed (midazolam)           Current Medications (04/06/2021):  This is the current hospital active medication list Current Facility-Administered Medications  Medication Dose Route Frequency Provider Last Rate Last Admin   0.9 %  sodium chloride infusion  250 mL Intravenous PRN Kathleene Hazel, MD       acetaminophen (TYLENOL) tablet 650 mg  650 mg Oral Q4H PRN Kathleene Hazel, MD   650 mg at 04/02/21 2151   aspirin EC tablet 81 mg  81 mg Oral Daily Kathleene Hazel, MD   81 mg at 04/06/21 0829   atorvastatin (LIPITOR) tablet 80 mg  80 mg Oral Daily Kathleene Hazel, MD   80 mg at 04/06/21 0830   cefTRIAXone (ROCEPHIN) 1 g in sodium chloride 0.9 % 100 mL IVPB  1 g Intravenous Q24H Mosetta Anis, RPH       Chlorhexidine Gluconate Cloth 2 % PADS 6 each  6 each Topical Daily Kathleene Hazel, MD   6 each at 04/06/21 0832   enoxaparin (LOVENOX) injection 30 mg  30 mg Subcutaneous Q24H Tonny Bollman, MD       feeding supplement (BOOST / RESOURCE BREEZE) liquid 1 Container  1 Container Oral TID BM Tonny Bollman, MD   1 Container at 04/06/21 9381   haloperidol lactate (HALDOL) injection 2 mg  2 mg Intravenous Q6H PRN Verne Carrow  D, MD   2 mg at 04/06/21 0236   hydrOXYzine (ATARAX) tablet 25 mg  25 mg Oral TID PRN Clerance Lav, MD   25 mg at 04/05/21 2251   lip balm (CARMEX) ointment   Topical PRN Runell Gess, MD   Given at 04/05/21 1204   melatonin tablet 5 mg  5 mg Oral Mickel Crow, MD   5 mg at 04/05/21 1847   menthol-cetylpyridinium (CEPACOL) lozenge 3 mg  1 lozenge Oral Once Runell Gess, MD       metoprolol succinate (TOPROL-XL) 24 hr tablet 50 mg  50 mg Oral Daily Runell Gess, MD   50 mg at 04/06/21 6834   multivitamin with minerals tablet 1 tablet  1 tablet Oral Daily Tonny Bollman, MD   1 tablet at  04/06/21 0831   nitroGLYCERIN (NITROSTAT) SL tablet 0.4 mg  0.4 mg Sublingual Q5 Min x 3 PRN Kathleene Hazel, MD       ondansetron [facility]) injection 4 mg  4 mg Intravenous Q6H PRN Kathleene Hazel, MD   4 mg at 04/02/21 2215   oxybutynin (DITROPAN-XL) 24 hr tablet 10 mg  10 mg Oral QHS Kathleene Hazel, MD   10 mg at 04/05/21 2118   sodium chloride (OCEAN) 0.65 % nasal spray 1 spray  1 spray Each Nare PRN Runell Gess, MD   1 spray at 04/05/21 1036   sodium chloride flush (NS) 0.9 % injection 3 mL  3 mL Intravenous Q12H Kathleene Hazel, MD   3 mL at 04/06/21 0831   spironolactone (ALDACTONE) tablet 12.5 mg  12.5 mg Oral Daily Runell Gess, MD   12.5 mg at 04/06/21 1962   ticagrelor (BRILINTA) tablet 90 mg  90 mg Oral BID Kathleene Hazel, MD   90 mg at 04/06/21 2297   traZODone (DESYREL) tablet 50 mg  50 mg Oral QHS PRN Clerance Lav, MD   50 mg at 04/05/21 2118     Discharge Medications: Please see discharge summary for a list of discharge medications.  Relevant Imaging Results:  Relevant Lab Results:   Additional Information SSN# [id number] Pt is covid vaccinated.  Keavon Sensing Aris Lot, LCSW

## 2021-04-06 NOTE — Progress Notes (Addendum)
° °   ° °  RE:  Virginia Roberts       Date of Birth:  1940-07-25     Date:   04/06/2021      To Whom It May Concern:  Please be advised that the above-named patient will require a short-term nursing home stay - anticipated 30 days or less for rehabilitation and strengthening.  The plan is for return home.   Runell Gess, M.D., FACP, [facility], [facility] [address] [place], [place]  [address]  [phone] 04/06/2021 1:58 PM

## 2021-04-06 NOTE — Progress Notes (Signed)
Mobility Specialist Progress Note   04/06/21 1220  Mobility  Activity Ambulated in hall  Level of Assistance Minimal assist, patient does 75% or more  Assistive Device Four wheel walker  Distance Ambulated (ft) 490 ft  Mobility Ambulated with assistance in hallway  Mobility Response Tolerated well  Mobility performed by Mobility specialist  $Mobility charge 1 Mobility   Received pt in chair having no complaints and agreeable. Session limited d/t cognition deficit, pt ambulates well with no symptoms but requires max cues for observational awareness and general focus. Returned back to chair w/ alarm on and call bell in reach.     Pre Mobility: 72 HR During Mobility: 89 HR  Post Mobility: 78 HR  Judeen Hammans Phone Number [phone]

## 2021-04-06 NOTE — Progress Notes (Signed)
Pt transferred from [facility] to 6E20 via wheelchair. Upon arrival to unit pt obviously confused -oriented only to self, arguing with staff, pt becoming increasingly agitated. Pt states she has been kidnapped and needs someone to call the police. Pt requested her daughter or son be called. I did try to call phone numbers listed, no one answered and not able to leave voicemail d/t full mailbox. With much enticement, pt eventually calmed down and allowed [place] to get her situated in bed and apply telemetry. Per report, Urine specimen sent earlier was positive for UTI, will notify MD for treatment. Will continue to monitor. Dierdre Highman, RN

## 2021-04-06 NOTE — Evaluation (Signed)
Occupational Therapy Evaluation Patient Details Name: Virginia Roberts MRN: [id number] DOB: 05/03/1940 Today's Date: 04/06/2021   History of Present Illness Pt is a 80 y.o. F who presents 04/05/2021 with STEMI now s/p PCI. Significant PMH: DVT, HLD, systolic heart failure.   Clinical Impression   Pt presents with decline in function and safety with ADLs and ADL mobility with impaired strength, balance, endurance and cognition. Pt lives at home alone and was Ind with ADLs and mobility; her spouse is currently at a SNF in [place]. Pt pleasantly confused with decreased safety awareness, is impulsive, easily distracted and unable to follow some commands correctly with STM deficits. Pt currently requires min guard A with LB ADL with mod verbal and physical cues to correctly initiate and Sup with mobility with mod verbal cues for safety. Pt would benefit from acute OT services to address impairments to maximize level of function and safety     Recommendations for follow up therapy are one component of a multi-disciplinary discharge planning process, led by the attending physician.  Recommendations may be updated based on patient status, additional functional criteria and insurance authorization.   Follow Up Recommendations  Skilled nursing-short term rehab (<3 hours/day)    Assistance Recommended at Discharge Frequent or constant Supervision/Assistance  Functional Status Assessment  Patient has had a recent decline in their functional status and demonstrates the ability to make significant improvements in function in a reasonable and predictable amount of time.  Equipment Recommendations  Tub/shower seat    Recommendations for Other Services       Precautions / Restrictions Precautions Precautions: Fall Restrictions Weight Bearing Restrictions: No LUE Weight Bearing: Weight bearing as tolerated      Mobility Bed Mobility               General bed mobility comments: sitting EOB  upon arrival with NT present    Transfers Overall transfer level: Needs assistance   Transfers: Sit to/from Stand Sit to Stand: Supervision                  Balance Overall balance assessment: Mild deficits observed, not formally tested                                         ADL either performed or assessed with clinical judgement   ADL                                         General ADL Comments: Sup with UB ADLs, toileting, min guard A with LB ADLs standing     Vision Baseline Vision/History: 1 Wears glasses Ability to See in Adequate Light: 0 Adequate Patient Visual Report: No change from baseline       Perception     Praxis      Pertinent Vitals/Pain Pain Assessment: No/denies pain     Hand Dominance Right   Extremity/Trunk Assessment Upper Extremity Assessment Upper Extremity Assessment: Generalized weakness   Lower Extremity Assessment Lower Extremity Assessment: Defer to PT evaluation       Communication Communication Communication: No difficulties   Cognition Arousal/Alertness: Awake/alert Behavior During Therapy: Impulsive Overall Cognitive Status: Impaired/Different from baseline Area of Impairment: Orientation;Attention;Memory;Following commands;Safety/judgement;Awareness;Problem solving  Orientation Level: Disoriented to;Situation   Memory: Decreased short-term memory Following Commands: Follows one step commands inconsistently Safety/Judgement: Decreased awareness of safety;Decreased awareness of deficits   Problem Solving: Difficulty sequencing;Requires verbal cues General Comments: Pt easily distracted, not following instructions correctly and with tangential speech     General Comments       Exercises     Shoulder Instructions      Home Living Family/patient expects to be discharged to:: Private residence Living Arrangements: Alone (spouse is in a SNF in [place]  [place]) Available Help at Discharge: Family;Available PRN/intermittently Type of Home: House Home Access: Stairs to enter [employer] of Steps: 1   Home Layout: One level     Bathroom Shower/Tub: Producer, television/film/video: Standard     Home Equipment: Rollator (4 wheels);Cane - single point          Prior Functioning/Environment Prior Level of Function : Independent/Modified Independent                        OT Problem List: Decreased strength;Impaired balance (sitting and/or standing);Decreased cognition;Decreased safety awareness;Decreased activity tolerance;Decreased knowledge of use of DME or AE      OT Treatment/Interventions: Self-care/ADL training;Patient/family education;Balance training;Therapeutic activities;DME and/or AE instruction    OT Goals(Current goals can be found in the care plan section) Acute Rehab OT Goals Patient Stated Goal: none stated OT Goal Formulation: With patient Time For Goal Achievement: 04/20/21 Potential to Achieve Goals: Good  OT Frequency: Min 2X/week   Barriers to D/C:            Co-evaluation              AM-PAC OT "6 Clicks" Daily Activity     Outcome Measure Help from another person eating meals?: None Help from another person taking care of personal grooming?: A Little Help from another person toileting, which includes using toliet, bedpan, or urinal?: A Little Help from another person bathing (including washing, rinsing, drying)?: A Little Help from another person to put on and taking off regular upper body clothing?: A Little Help from another person to put on and taking off regular lower body clothing?: A Little 6 Click Score: 19   End of Session Equipment Utilized During Treatment: Gait belt  Activity Tolerance: Patient tolerated treatment well Patient left: in chair;with call bell/phone within reach;with chair alarm set;with nursing/sitter in room  OT Visit Diagnosis: Unsteadiness on  feet (R26.81);Other abnormalities of gait and mobility (R26.89);Muscle weakness (generalized) (M62.81);Other symptoms and signs involving cognitive function                Time: 3810-1751 OT Time Calculation (min): 29 min Charges:  OT General Charges $OT Visit: 1 Visit OT Evaluation $OT Eval Moderate Complexity: 1 Mod OT Treatments $Self Care/Home Management : 8-22 mins    Galen Manila 04/06/2021, 1:13 PM

## 2021-04-06 NOTE — Care Management Important Message (Signed)
Important Message  Patient Details  Name: Virginia Roberts MRN: [id number] Date of Birth: 1940-10-13   Medicare Important Message Given:  Yes     Renie Ora 04/06/2021, 9:21 AM

## 2021-04-06 NOTE — TOC Progression Note (Signed)
Transition of Care [facility]) - Progression Note    Patient Details  Name: Virginia Roberts MRN: [id number] Date of Birth: 03-19-1941  Transition of Care [facility]) CM/SW Contact  Erin Sons, [place] Phone Number: 04/06/2021, 11:16 AM  Clinical Narrative:     CSW called pt's son to discuss disposition. CSW spoke with son about SNF vs HH. Son reports that pt lives with her husband in [place] but that pt's husband is currently in a SNF in [place].  He reports that prior to admission pt was very independent. He explained that pt possibly could stay with him with [facility] and family would be able to provide 24/7 supervision. He believes that her cognition may improve if she were in a more familiar place. He explained they would like to see how pt's cognition improves before deciding if they would take her home. He is willing for CSW to begin SNF workup for [place] areas. He reports pt is covid vaccinated but is unsure how many vaccines she has had.        Expected Discharge Plan and Services                                                 Social Determinants of Health (SDOH) Interventions    Readmission Risk Interventions No flowsheet data found.

## 2021-04-06 NOTE — Progress Notes (Signed)
Pt has remained confused -oriented to self only. She is more cooperative but easily agitated. And continues to attempt to get OOB without assist. Order for bedside sitter renewed per protocol. Will continue to monitor. Dierdre Highman, RN

## 2021-04-06 NOTE — Progress Notes (Signed)
Pt was transferred to 6E rm 20 via wheelchair. Report given to Cedar, Charity fundraiser. Pt continued to be confused and agitated during tx and upon arrival to new unit. 6E staff kindly assisted Mrs. Hallisey to bed. Vitals remained stable during transfer.

## 2021-04-06 NOTE — Progress Notes (Signed)
Report given to Annabelle Harman, RN at bedside. Patient's daughter at bedside as well. Confused throughout shift. Patient due to void by 2000 and Annabelle Harman, RN aware.

## 2021-04-06 NOTE — Progress Notes (Addendum)
Progress Note  Patient Name: Virginia Roberts Date of Encounter: 04/06/2021  [facility] Cardiologist: None   Subjective   More alert and oriented this morning. Able to state location and why she came in. Still confused about ongoing circumstances. NT at the bedside reports some hallucinations overnight.   Inpatient Medications    Scheduled Meds:  aspirin EC  81 mg Oral Daily   atorvastatin  80 mg Oral Daily   Chlorhexidine Gluconate Cloth  6 each Topical Daily   enoxaparin (LOVENOX) injection  30 mg Subcutaneous Q24H   feeding supplement  1 Container Oral TID BM   melatonin  5 mg Oral QHS   menthol-cetylpyridinium  1 lozenge Oral Once   metoprolol succinate  50 mg Oral Daily   multivitamin with minerals  1 tablet Oral Daily   oxybutynin  10 mg Oral QHS   sodium chloride flush  3 mL Intravenous Q12H   spironolactone  12.5 mg Oral Daily   ticagrelor  90 mg Oral BID   Continuous Infusions:  sodium chloride     PRN Meds: sodium chloride, acetaminophen, haloperidol lactate, hydrOXYzine, lip balm, nitroGLYCERIN, ondansetron (ZOFRAN) IV, sodium chloride, traZODone   Vital Signs    Vitals:   04/06/21 0100 04/06/21 0418 04/06/21 0816 04/06/21 0829  BP: (!) 132/96  110/81   Pulse: 82   78  Resp: 20  18   Temp:  97.9 F (36.6 C) 97.9 F (36.6 C)   TempSrc:  Oral Oral   SpO2: 96% 99% 98%   Weight:      Height:        Intake/Output Summary (Last 24 hours) at 04/06/2021 0926 Last data filed at 04/06/2021 9326 Gross per 24 hour  Intake 120 ml  Output 812 ml  Net -692 ml   Last 3 Weights 04/01/2021 02/11/2014  Weight (lbs) 115 lb 136 lb 1.6 oz  Weight (kg) 52.164 kg 61.735 kg      Telemetry    SR - Personally Reviewed  ECG    No new tracing  Physical Exam   GEN: No acute distress.   Neck: No JVD Cardiac: RRR, + systolic murmur LLSB, rubs, or gallops.  Respiratory: Clear to auscultation bilaterally. GI: Soft, nontender, non-distended  MS: No edema; No  deformity. Left radial cath site stable.  Neuro:  Nonfocal  Psych: Normal affect   Labs    High Sensitivity Troponin:   Recent Labs  Lab 04/01/21 1342 04/01/21 1650  TROPONINIHS 95* 19,301*     Chemistry Recent Labs  Lab 04/01/21 1342 04/02/21 0053 04/02/21 0243 04/03/21 0243 04/05/21 0839 04/05/21 1109 04/06/21 0208  NA 137   < > 137 137 133*  --  134*  K 3.2*   < > 3.5 3.5 2.9*  --  3.6  CL 104  --  100 100 98  --  99  CO2 25  --  26 29 25   --  25  GLUCOSE 132*  --  158* 135* 113*  --  134*  BUN 14  --  12 17 18   --  19  CREATININE 0.81  --  0.94 1.08* 0.96  --  0.96  CALCIUM 8.7*  --  8.9 8.5* 8.2*  --  8.9  MG 1.7  --  2.3 1.9  --  1.9  --   PROT 5.7*  --   --   --   --   --   --   ALBUMIN 3.7  --   --   --   --   --   --  AST 34  --   --   --   --   --   --   ALT 19  --   --   --   --   --   --   ALKPHOS 50  --   --   --   --   --   --   BILITOT 1.2  --   --   --   --   --   --   GFRNONAA >60  --  >60 52* >60  --  >60  ANIONGAP 8  --  11 8 10   --  10   < > = values in this interval not displayed.    Lipids  Recent Labs  Lab 04/02/21 0243  CHOL 188  TRIG 107  HDL 75  LDLCALC 92  CHOLHDL 2.5    Hematology Recent Labs  Lab 04/01/21 1342 04/02/21 0053 04/02/21 0243 04/03/21 0243  WBC 6.8  --  8.6 9.6  RBC 3.78*  --  4.33 3.77*  HGB 12.1 14.3 14.3 12.2  HCT 37.0 42.0 42.2 37.1  MCV 97.9  --  97.5 98.4  MCH 32.0  --  33.0 32.4  MCHC 32.7  --  33.9 32.9  RDW 12.8  --  12.9 12.9  PLT 173  --  198 147*   Thyroid  Recent Labs  Lab 04/01/21 1650  TSH 1.384  FREET4 1.44*    BNP Recent Labs  Lab 04/01/21 1351  BNP 982.3*    DDimer No results for input(s): DDIMER in the last 168 hours.   Radiology    No results found.  Cardiac Studies   Cath: 04/01/21    Mid RCA lesion is 75% stenosed.   Mid Cx lesion is 100% stenosed.   2nd Mrg lesion is 100% stenosed.   Prox LAD to Mid LAD lesion is 40% stenosed.   A drug-eluting stent was  successfully placed using a STENT ONYX FRONTIER 3.0X26.   Post intervention, there is a 0% residual stenosis.   Acute inferoposterior STEMI secondary to occlusion of the mid circumflex, treated successfully with primary PCI using a 3.0x26 mm Onyx Frontier DES. 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    **for future cath do NOT use right radial artery**  Diagnostic Dominance: Right Intervention    Cath: 04/04/21    Mid RCA lesion is 75% stenosed.   A drug-eluting stent was successfully placed using a STENT ONYX FRONTIER 2.75X15.   Post intervention, there is a 0% residual stenosis.   Severe mid RCA stenosis Successful PTCA/DES x 1 mid RCA   Recommendations: DAPT with ASA/Brilinta for 12 months if tolerated. Continue beta blocker and statin.   Diagnostic Dominance: Right Intervention    Patient Profile     80 y.o. female with past medical history of hypertension who presented from [facility] with STEMI.  Assessment & Plan    Inferior lateral STEMI: Underwent cardiac cath 12/16 showing occluded circumflex treated with PCI/DES x1.  Noted on DAPT with aspirin/Brilinta.  Did have residual disease in the RCA which was treated in a staged fashion on 12/19 with PCI/DES x1.  Of note did have significant tortuosity in the right radial site, therefore was recommended to go left radial for repeat  cath.  No recurrent chest pain. 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  She did have elevated LVEDP on cath and treated with IV Lasix.  Remains net -6.7 L -- On beta-blocker as well as spironolactone  Hyperlipidemia: On statin therapy  Valvular disease:  Echo 12/16 with severe mitral regurgitation, severe TR as well as moderate to severe aortic regurgitation  Altered mental status/UTI: Developed confusion in the afternoon throughout the evening.  Given this UA was checked and positive for UTI --We will start IV Rocephin --Check urine culture  Dispo: Evaluated by PT yesterday with recommendations for SNF at discharge.   For questions or updates, please contact [facility] Please consult [email or web address] for contact info under     Signed, Laverda Page, NP  04/06/2021, 9:26 AM     Agree with note by Laverda Page NP-C  Patient clinically stable from a cardiac point of view.  Patient was combative overnight.  UA compatible with UTI.  Will start Rocephin.  Exam benign.  She is with a Comptroller today.  Will reassess clinically tomorrow morning.  I suspect she will need SNF for short period time prior to going home.   Runell Gess, M.D., FACP, [facility], [facility] [address] [place], [place]  [address]  [phone] 04/06/2021 10:38 AM

## 2021-04-07 ENCOUNTER — Inpatient Hospital Stay (HOSPITAL_COMMUNITY): Payer: Medicare Other

## 2021-04-07 DIAGNOSIS — G9340 Encephalopathy, unspecified: Secondary | ICD-10-CM

## 2021-04-07 DIAGNOSIS — I5041 Acute combined systolic (congestive) and diastolic (congestive) heart failure: Secondary | ICD-10-CM

## 2021-04-07 LAB — CBC
HCT: 37.8 % (ref 36.0–46.0)
Hemoglobin: 13.2 g/dL (ref 12.0–15.0)
MCH: 33.2 pg (ref 26.0–34.0)
MCHC: 34.9 g/dL (ref 30.0–36.0)
MCV: 95.2 fL (ref 80.0–100.0)
Platelets: 204 10*3/uL (ref 150–400)
RBC: 3.97 MIL/uL (ref 3.87–5.11)
RDW: 12.7 % (ref 11.5–15.5)
WBC: 8 10*3/uL (ref 4.0–10.5)
nRBC: 0 % (ref 0.0–0.2)

## 2021-04-07 LAB — BASIC METABOLIC PANEL
Anion gap: 11 (ref 5–15)
BUN: 19 mg/dL (ref 8–23)
CO2: 22 mmol/L (ref 22–32)
Calcium: 8.8 mg/dL — ABNORMAL LOW (ref 8.9–10.3)
Chloride: 104 mmol/L (ref 98–111)
Creatinine, Ser: 0.77 mg/dL (ref 0.44–1.00)
GFR, Estimated: >60 mL/min (ref >60)
Glucose, Bld: 103 mg/dL — ABNORMAL HIGH (ref 70–99)
Potassium: 3.7 mmol/L (ref 3.5–5.1)
Sodium: 137 mmol/L (ref 135–145)

## 2021-04-07 LAB — AMMONIA: Ammonia: 29 umol/L (ref 9–35)

## 2021-04-07 LAB — VITAMIN B12: Vitamin B-12: 1736 pg/mL — ABNORMAL HIGH (ref 180–914)

## 2021-04-07 LAB — TSH: TSH: 2.532 u[IU]/mL (ref 0.350–4.500)

## 2021-04-07 NOTE — Consult Note (Addendum)
Medical Consultation   Angelinah Nickson  [id number]  DOB: 10/14/1940  DOA: 04/01/2021  PCP: Leone Haven, MD   Outpatient Specialists: none    Requesting physician: Michele Mcalpine, NP with cardiology   Reason for consultation: Confusion/UTI    History of Present Illness: Armiyah Almeda is an 81 y.o. female with history of HTN who presented to [facility] with STEMI and was admitted by cardiology services on 04/01/2021. She was directly sent to cath lab where occlusion of the mid circumflex was found and treated successfully with primary PCI. 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 Post MI therapy done and staged PCI to RCA was completed 04/04/21. Echo also showed systolic CHF with EF [id number] and severe valvular disease. On the night of 04/04/21 she started to have some agitation and confusion. UA and culture were obtained and came back positive for e.coli. she was started on rocephin 04/06/2021. Her confusion has improved, but she needed restraints last night and continues to have episodes of confusion. TRH was consulted for new confusion during hospitalization in setting of UTI.   She is in bed eating her lunch. Very pleasant. PT has seen improvement in her confusion. She is able to tell me why she is here and is oriented to person, place and date (minus the year). She overall feels fine. Doesn't really recall being confused, but is able to tell me people keep telling her this. She has no complaints.   Called and talked to her son who states she lives at home with her husband. He is currently at [facility] rehab. She is fully independent. Her son states they are starting to notice some very mild memory issues recently. She will forget and retell things during the week two or three times, but otherwise she is "sharp as a tact."   Review of Systems  Constitutional:  Negative for chills and fever.  HENT:  Positive for postnasal drip. Negative for congestion, ear pain,  sinus pressure, sinus pain and sore throat.   Eyes:  Negative for visual disturbance.  Respiratory:  Positive for shortness of breath. Negative for cough and wheezing.   Cardiovascular:  Negative for chest pain, palpitations and leg swelling.  Gastrointestinal:  Negative for abdominal pain, diarrhea, nausea and vomiting.  Genitourinary:  Negative for dysuria, flank pain, frequency, hematuria and urgency.  Neurological:  Negative for dizziness, weakness, light-headedness and headaches.  Hematological:  Does not bruise/bleed easily.   Past Medical History: Past Medical History:  Diagnosis Date   [place]    Varicose veins     Past Surgical History: Past Surgical History:  Procedure Laterality Date   CORONARY STENT INTERVENTION N/A 04/04/2021   Procedure: CORONARY STENT INTERVENTION;  Surgeon: Burnell Blanks, MD;  Location: [place] [facility];  Service: Cardiovascular;  Laterality: N/A;   CORONARY/GRAFT ACUTE MI REVASCULARIZATION N/A 04/01/2021   Procedure: Coronary/Graft Acute MI Revascularization;  Surgeon: Sherren Mocha, MD;  Location: [place] [facility];  Service: Cardiovascular;  Laterality: N/A;   TUBAL LIGATION  1972   VEIN LIGATION AND STRIPPING Right late 1970s     Allergies:   Allergies  Allergen Reactions   Metoprolol Hives   Cantil [Mepenzolate] Hives   Diltiazem Nausea And Vomiting and Other (See Comments)    Dizziness/vertigo   Erythromycin Other (See Comments)    Stomach cramps   Versed [Midazolam] Other (See Comments)    Confusion/agitation x2  days after receiving versed.      Social History:  reports that she has never smoked. She does not have any smokeless tobacco history on file. She reports that she does not drink alcohol and does not use drugs.   Family History: reviewed and noted Family History  Problem Relation Age of Onset   Cancer Brother    Hyperlipidemia Brother    Hypertension Brother    Heart disease  Brother    Varicose Veins Brother    Diabetes Daughter    Hyperlipidemia Daughter    Diabetes Son    Hypertension Son    Hyperlipidemia Son      Physical Exam: Vitals:   04/06/21 2331 04/07/21 0837 04/07/21 1400 04/07/21 1401  BP: 128/90 135/71 127/75   Pulse: 85 75 91 73  Resp: 20 19 (!) 24   Temp:  98.6 F (37 C) 98.2 F (36.8 C)   TempSrc:  Axillary Axillary   SpO2: 95% 95% 99%   Weight:      Height:        Constitutional:  Alert and awake, oriented x3, not in any acute distress. Eating her lunch  Eyes: PERLA, EOMI, irises appear normal, anicteric sclera,  ENMT: external ears and nose appear normal, normal hearing             Lips appears normal, oropharynx mucosa, tongue, posterior pharynx appear normal  Neck: neck appears normal, no masses, normal ROM, no thyromegaly, no JVD  CVS: S1-S2 clear, no murmur rubs or gallops, no LE edema, normal pedal pulses  Respiratory:  faint crackles in bilateral bases.  no wheezing. Marland Kitchen Respiratory effort normal. No accessory muscle use.  Abdomen: soft nontender, nondistended, normal bowel sounds, no hepatosplenomegaly, no hernias  Musculoskeletal: : no cyanosis, clubbing or edema noted bilaterally                       Strength intact bilaterally. No unilateral weakness. DTR 2+ Neuro: Cranial nerves II-XII intact, strength, sensation, reflexes Psych: judgement and insight appear normal, stable mood and affect, mental status Skin: no rashes or lesions or ulcers, no induration or nodules    Data reviewed:  I have personally reviewed following labs and imaging studies Labs:  CBC: Recent Labs  Lab 04/01/21 1342 04/02/21 0053 04/02/21 0243 04/03/21 0243 04/07/21 0856  WBC 6.8  --  8.6 9.6 8.0  HGB 12.1 14.3 14.3 12.2 13.2  HCT 37.0 42.0 42.2 37.1 37.8  MCV 97.9  --  97.5 98.4 95.2  PLT 173  --  198 147* 0000000    Basic Metabolic Panel: Recent Labs  Lab 04/01/21 1342 04/02/21 0053 04/02/21 0243 04/03/21 0243 04/05/21 0839  04/05/21 1109 04/06/21 0208 04/07/21 0432  NA 137   < > 137 137 133*  --  134* 137  K 3.2*   < > 3.5 3.5 2.9*  --  3.6 3.7  CL 104  --  100 100 98  --  99 104  CO2 25  --  26 29 25   --  25 22  GLUCOSE 132*  --  158* 135* 113*  --  134* 103*  BUN 14  --  12 17 18   --  19 19  CREATININE 0.81  --  0.94 1.08* 0.96  --  0.96 0.77  CALCIUM 8.7*  --  8.9 8.5* 8.2*  --  8.9 8.8*  MG 1.7  --  2.3 1.9  --  1.9  --   --    < > =  values in this interval not displayed.   GFR Estimated Creatinine Clearance: 41 mL/min (by C-G formula based on SCr of 0.77 mg/dL). Liver Function Tests: Recent Labs  Lab 04/01/21 1342  AST 34  ALT 19  ALKPHOS 50  BILITOT 1.2  PROT 5.7*  ALBUMIN 3.7   No results for input(s): LIPASE, AMYLASE in the last 168 hours. No results for input(s): AMMONIA in the last 168 hours. Coagulation profile Recent Labs  Lab 04/01/21 1342  INR 1.2    Cardiac Enzymes: No results for input(s): CKTOTAL, CKMB, CKMBINDEX, TROPONINI in the last 168 hours. BNP: Invalid input(s): POCBNP CBG: Recent Labs  Lab 04/02/21 2230  GLUCAP 170*   D-Dimer No results for input(s): DDIMER in the last 72 hours. Hgb A1c No results for input(s): HGBA1C in the last 72 hours. Lipid Profile No results for input(s): CHOL, HDL, LDLCALC, TRIG, CHOLHDL, LDLDIRECT in the last 72 hours. Thyroid function studies No results for input(s): TSH, T4TOTAL, T3FREE, THYROIDAB in the last 72 hours.  Invalid input(s): FREET3 Anemia work up No results for input(s): VITAMINB12, FOLATE, FERRITIN, TIBC, IRON, RETICCTPCT in the last 72 hours. Urinalysis    Component Value Date/Time   COLORURINE AMBER (A) 04/05/2021 1807   APPEARANCEUR CLOUDY (A) 04/05/2021 1807   LABSPEC 1.021 04/05/2021 1807   PHURINE 5.0 04/05/2021 1807   GLUCOSEU NEGATIVE 04/05/2021 1807   HGBUR LARGE (A) 04/05/2021 1807   BILIRUBINUR NEGATIVE 04/05/2021 1807   KETONESUR 5 (A) 04/05/2021 1807   PROTEINUR 100 (A) 04/05/2021 1807    NITRITE NEGATIVE 04/05/2021 1807   LEUKOCYTESUR LARGE (A) 04/05/2021 1807     Sepsis Labs Invalid input(s): PROCALCITONIN,  WBC,  LACTICIDVEN Microbiology Recent Results (from the past 240 hour(s))  Resp Panel by RT-PCR (Flu A&B, Covid) Nasopharyngeal Swab     Status: None   Collection Time: 04/01/21  1:11 PM   Specimen: Nasopharyngeal Swab; Nasopharyngeal(NP) swabs in vial transport medium  Result Value Ref Range Status   SARS Coronavirus 2 by RT PCR NEGATIVE NEGATIVE Final    Comment: (NOTE) SARS-CoV-2 target nucleic acids are NOT DETECTED.  The SARS-CoV-2 RNA is generally detectable in upper respiratory specimens during the acute phase of infection. The lowest concentration of SARS-CoV-2 viral copies this assay can detect is 138 copies/mL. A negative result does not preclude SARS-Cov-2 infection and should not be used as the sole basis for treatment or other patient management decisions. A negative result may occur with  improper specimen collection/handling, submission of specimen other than nasopharyngeal swab, presence of viral mutation(s) within the areas targeted by this assay, and inadequate number of viral copies(<138 copies/mL). A negative result must be combined with clinical observations, patient history, and epidemiological information. The expected result is Negative.  Fact Sheet for Patients:  BloggerCourse.com  Fact Sheet for Healthcare Providers:  SeriousBroker.it  This test is no t yet approved or cleared by the [place] FDA and  has been authorized for detection and/or diagnosis of SARS-CoV-2 by FDA under an Emergency Use Authorization (EUA). This EUA will remain  in effect (meaning this test can be used) for the duration of the COVID-19 declaration under Section 564(b)(1) of the Act, 21 U.S.C.section 360bbb-3(b)(1), unless the authorization is terminated  or revoked sooner.       Influenza A by  PCR NEGATIVE NEGATIVE Final   Influenza B by PCR NEGATIVE NEGATIVE Final    Comment: (NOTE) The Xpert Xpress SARS-CoV-2/FLU/RSV plus assay is intended as an aid in the diagnosis of  influenza from Nasopharyngeal swab specimens and should not be used as a sole basis for treatment. Nasal washings and aspirates are unacceptable for Xpert Xpress SARS-CoV-2/FLU/RSV testing.  Fact Sheet for Patients: BloggerCourse.com  Fact Sheet for Healthcare Providers: SeriousBroker.it  This test is not yet approved or cleared by the [place] FDA and has been authorized for detection and/or diagnosis of SARS-CoV-2 by FDA under an Emergency Use Authorization (EUA). This EUA will remain in effect (meaning this test can be used) for the duration of the COVID-19 declaration under Section 564(b)(1) of the Act, 21 U.S.C. section 360bbb-3(b)(1), unless the authorization is terminated or revoked.  Performed at [facility], [address], [place], [place] [address]   MRSA Next Gen by PCR, Nasal     Status: None   Collection Time: 04/01/21  4:40 PM   Specimen: Nasal Mucosa; Nasal Swab  Result Value Ref Range Status   MRSA by PCR Next Gen NOT DETECTED NOT DETECTED Final    Comment: (NOTE) The GeneXpert MRSA Assay (FDA approved for NASAL specimens only), is one component of a comprehensive MRSA colonization surveillance program. It is not intended to diagnose MRSA infection nor to guide or monitor treatment for MRSA infections. Test performance is not FDA approved in patients less than 40 years old. Performed at [facility], [address], [place], [place] [address]   Urine Culture     Status: Abnormal (Preliminary result)   Collection Time: 04/05/21  6:07 PM   Specimen: Urine, Clean Catch  Result Value Ref Range Status   Specimen Description URINE, CLEAN CATCH  Final   Special Requests NONE  Final   Culture (A)  Final    >=100,000  COLONIES/mL ESCHERICHIA COLI SUSCEPTIBILITIES TO FOLLOW Performed at [facility], [address], [place], [place] [address]    Report Status PENDING  Incomplete       Inpatient Medications:   Scheduled Meds:  aspirin EC  81 mg Oral Daily   atorvastatin  80 mg Oral Daily   enoxaparin (LOVENOX) injection  30 mg Subcutaneous Q24H   feeding supplement  1 Container Oral TID BM   melatonin  5 mg Oral QHS   menthol-cetylpyridinium  1 lozenge Oral Once   metoprolol succinate  50 mg Oral Daily   multivitamin with minerals  1 tablet Oral Daily   oxybutynin  10 mg Oral QHS   sodium chloride flush  3 mL Intravenous Q12H   spironolactone  12.5 mg Oral Daily   ticagrelor  90 mg Oral BID   Continuous Infusions:  sodium chloride     cefTRIAXone (ROCEPHIN)  IV 1 g (04/07/21 1412)     Radiological Exams on Admission: No results found.  Impression/Recommendations Principal Problem:   Acute encephalopathy/delirium during hospitalization -80 year old female initially admitted for STEMI with PCI intervention by cardiology who developed confusion and delirium during her hospital stay. Found to have UTI and is on day 2 of antibiotics. Culture sensitivities pending.  -continue rocephin for UTI and f/u on culture sensitivities to transition to oral abx. Likely main cause of her confusion/delirium -discussed with family and they can tell a very big improvement in her confusion  -metabolic work up to include ammonia, TSH and B12-uneventful  -check CXR/cultures  -some new drugs can also cause/prolong delirium (beta blockers) -delirium precautions   Mass on CXR She has no smoking history, remote hx of second hand exposure No environmental exposure that family is aware of  CT chest with contrast ordered, discussed with family   Active Problems:  STEMI involving oth coronary artery of inferior wall [facility]) Per cardiology management  Hypertension Per cardiology     Thank you for this  consultation.  Our [facility] hospitalist team will follow the patient with you.   Time Spent: 18 minutes   Orma Flaming M.D. [facility] 04/07/2021, 3:03 PM

## 2021-04-07 NOTE — Progress Notes (Signed)
Progress Note  Patient Name: Virginia Roberts Date of Encounter: 04/07/2021  [facility] Cardiologist: None   Subjective   Patient in restraints.  Denies chest pain.  Minimally oriented.  Will not open eyes to engage.  Inpatient Medications    Scheduled Meds:  aspirin EC  81 mg Oral Daily   atorvastatin  80 mg Oral Daily   enoxaparin (LOVENOX) injection  30 mg Subcutaneous Q24H   feeding supplement  1 Container Oral TID BM   melatonin  5 mg Oral QHS   menthol-cetylpyridinium  1 lozenge Oral Once   metoprolol succinate  50 mg Oral Daily   multivitamin with minerals  1 tablet Oral Daily   oxybutynin  10 mg Oral QHS   sodium chloride flush  3 mL Intravenous Q12H   spironolactone  12.5 mg Oral Daily   ticagrelor  90 mg Oral BID   Continuous Infusions:  sodium chloride     cefTRIAXone (ROCEPHIN)  IV 1 g (04/06/21 1321)   PRN Meds: sodium chloride, acetaminophen, haloperidol lactate, hydrOXYzine, lip balm, nitroGLYCERIN, ondansetron (ZOFRAN) IV, sodium chloride, traZODone   Vital Signs    Vitals:   04/06/21 1600 04/06/21 1807 04/06/21 2036 04/06/21 2331  BP:  125/79 131/89 128/90  Pulse:  79 74 85  Resp: 19 20 18 20   Temp:  (!) 97.4 F (36.3 C) 97.6 F (36.4 C)   TempSrc:  Oral Oral   SpO2:  98% 98% 95%  Weight:      Height:        Intake/Output Summary (Last 24 hours) at 04/07/2021 0738 Last data filed at 04/06/2021 1400 Gross per 24 hour  Intake 120 ml  Output 600 ml  Net -480 ml   Last 3 Weights 04/01/2021 02/11/2014  Weight (lbs) 115 lb 136 lb 1.6 oz  Weight (kg) 52.164 kg 61.735 kg      Telemetry    SR - Personally Reviewed  ECG    No new tracing  Physical Exam   GEN: No acute distress.   Neck: No JVD Cardiac: RRR, + systolic murmur LLSB, rubs, or gallops.  Respiratory: Clear to auscultation bilaterally. GI: Soft, nontender, non-distended  MS: No edema; No deformity. Left radial cath site stable.  Neuro:  Nonfocal  Psych: Normal  affect   Labs    High Sensitivity Troponin:   Recent Labs  Lab 04/01/21 1342 04/01/21 1650  TROPONINIHS 95* 19,301*     Chemistry Recent Labs  Lab 04/01/21 1342 04/02/21 0053 04/02/21 0243 04/03/21 0243 04/05/21 0839 04/05/21 1109 04/06/21 0208 04/07/21 0432  NA 137   < > 137 137 133*  --  134* 137  K 3.2*   < > 3.5 3.5 2.9*  --  3.6 3.7  CL 104  --  100 100 98  --  99 104  CO2 25  --  26 29 25   --  25 22  GLUCOSE 132*  --  158* 135* 113*  --  134* 103*  BUN 14  --  12 17 18   --  19 19  CREATININE 0.81  --  0.94 1.08* 0.96  --  0.96 0.77  CALCIUM 8.7*  --  8.9 8.5* 8.2*  --  8.9 8.8*  MG 1.7  --  2.3 1.9  --  1.9  --   --   PROT 5.7*  --   --   --   --   --   --   --   ALBUMIN 3.7  --   --   --   --   --   --   --  AST 34  --   --   --   --   --   --   --   ALT 19  --   --   --   --   --   --   --   ALKPHOS 50  --   --   --   --   --   --   --   BILITOT 1.2  --   --   --   --   --   --   --   GFRNONAA >60  --  >60 52* >60  --  >60 >60  ANIONGAP 8  --  11 8 10   --  10 11   < > = values in this interval not displayed.    Lipids  Recent Labs  Lab 04/02/21 0243  CHOL 188  TRIG 107  HDL 75  LDLCALC 92  CHOLHDL 2.5    Hematology Recent Labs  Lab 04/01/21 1342 04/02/21 0053 04/02/21 0243 04/03/21 0243  WBC 6.8  --  8.6 9.6  RBC 3.78*  --  4.33 3.77*  HGB 12.1 14.3 14.3 12.2  HCT 37.0 42.0 42.2 37.1  MCV 97.9  --  97.5 98.4  MCH 32.0  --  33.0 32.4  MCHC 32.7  --  33.9 32.9  RDW 12.8  --  12.9 12.9  PLT 173  --  198 147*   Thyroid  Recent Labs  Lab 04/01/21 1650  TSH 1.384  FREET4 1.44*    BNP Recent Labs  Lab 04/01/21 1351  BNP 982.3*    DDimer No results for input(s): DDIMER in the last 168 hours.   Radiology    No results found.  Cardiac Studies   Cath: 04/01/21    Mid RCA lesion is 75% stenosed.   Mid Cx lesion is 100% stenosed.   2nd Mrg lesion is 100% stenosed.   Prox LAD to Mid LAD lesion is 40% stenosed.   A drug-eluting  stent was successfully placed using a STENT ONYX FRONTIER 3.0X26.   Post intervention, there is a 0% residual stenosis.   Acute inferoposterior STEMI secondary to occlusion of the mid circumflex, treated successfully with primary PCI using a 3.0x26 mm Onyx Frontier DES. 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    **for future cath do NOT use right radial artery**  Diagnostic Dominance: Right Intervention    Cath: 04/04/21    Mid RCA lesion is 75% stenosed.   A drug-eluting stent was successfully placed using a STENT ONYX FRONTIER 2.75X15.   Post intervention, there is a 0% residual stenosis.   Severe mid RCA stenosis Successful PTCA/DES x 1 mid RCA   Recommendations: DAPT with ASA/Brilinta for 12 months if tolerated. Continue beta blocker and statin.   Diagnostic Dominance: Right Intervention    Patient Profile     80 y.o. female with past medical history of hypertension who presented from [facility] with STEMI.  Assessment & Plan    Inferior lateral STEMI: Underwent cardiac cath 12/16 showing occluded circumflex treated with PCI/DES x1.  Now  on DAPT with aspirin/Brilinta.  Did have residual disease in the RCA which was treated in a staged fashion on 12/19 with PCI/DES x1.  Of note did have significant  tortuosity in the right radial site, therefore was recommended to go left radial for repeat cath.  No recurrent chest pain. 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  She did have elevated LVEDP on cath and treated with IV Lasix.  Remains net -6.9 L -- On beta-blocker as well as spironolactone  Hyperlipidemia: On statin therapy  Valvular  disease: Echo 12/16 with severe mitral regurgitation, severe TR as well as moderate to severe aortic regurgitation  Altered mental status/UTI: Developed confusion in the afternoon throughout the evening.  Given this UA was checked and positive for UTI --We will start IV Rocephin -- Urine culture pending  Dispo: Evaluated by PT with recommendations for SNF at discharge.  Patient is stable from a cardiac point of view.  Major issue is mental status and confusion.  We will get the hospitalist team to assist.  Patient will most likely need to go to an SNF at discharge.  For questions or updates, please contact [facility] Please consult [email or web address] for contact info under     Signed, Nanetta Batty, MD  04/07/2021, 7:38 AM

## 2021-04-07 NOTE — Progress Notes (Signed)
Physical Therapy Treatment Patient Details Name: Virginia Roberts MRN: [id number] DOB: 1941/01/07 Today's Date: 04/07/2021   History of Present Illness Pt is a 80 y.o. F who presents 04/05/2021 with STEMI now s/p PCI. Significant PMH: DVT, HLD, systolic heart failure.    PT Comments    Pt with sitter in room on entry, agreeable to ambulating with therapy. Pt noted to have increased difficulty with navigation around obstacles on L. Assessment of visual field reveals decreased L sided peripheral vision. Attempted to teach scanning environment with no carryover. Pt continues to be easily distracted externally and internally. D/c plans remain appropriate. PT will continue to follow acutely.    Recommendations for follow up therapy are one component of a multi-disciplinary discharge planning process, led by the attending physician.  Recommendations may be updated based on patient status, additional functional criteria and insurance authorization.  Follow Up Recommendations  Skilled nursing-short term rehab (<3 hours/day)     Assistance Recommended at Discharge Frequent or constant Supervision/Assistance  Equipment Recommendations  None recommended by PT       Precautions / Restrictions Precautions Precautions: Fall     Mobility  Bed Mobility               General bed mobility comments: OOB in chair    Transfers Overall transfer level: Needs assistance Equipment used: Rolling walker (2 wheels) Transfers: Sit to/from Stand Sit to Stand: Supervision                Ambulation/Gait Ambulation/Gait assistance: Min assist Gait Distance (Feet): 450 Feet Assistive device: Rolling walker (2 wheels) Gait Pattern/deviations: Step-through pattern;Decreased stride length Gait velocity: slowed Gait velocity interpretation: <1.31 ft/sec, indicative of household ambulator   General Gait Details: pt continues to have difficulty with navigation of RW, obviously not familiar with AD  usage          Balance Overall balance assessment: Mild deficits observed, not formally tested                                          Cognition Arousal/Alertness: Awake/alert Behavior During Therapy: Impulsive Overall Cognitive Status: Impaired/Different from baseline Area of Impairment: Orientation;Attention;Memory;Following commands;Safety/judgement;Awareness;Problem solving                 Orientation Level: Disoriented to;Place Current Attention Level: Sustained Memory: Decreased short-term memory Following Commands: Follows one step commands inconsistently Safety/Judgement: Decreased awareness of safety;Decreased awareness of deficits Awareness: Intellectual Problem Solving: Difficulty sequencing;Requires verbal cues General Comments: easily distracted externally and internally, requires constant redirection to task at hand.           General Comments General comments (skin integrity, edema, etc.): After running into multiple obstacles on L side assessed L visual field with decreased peripheral vision, tried to teach scanning environment with little carry over      Pertinent Vitals/Pain Pain Assessment: No/denies pain     PT Goals (current goals can now be found in the care plan section) Acute Rehab PT Goals Patient Stated Goal: see her family PT Goal Formulation: With patient Time For Goal Achievement: 04/19/21 Potential to Achieve Goals: Good Progress towards PT goals: Progressing toward goals    Frequency    Min 2X/week      PT Plan Current plan remains appropriate       AM-PAC PT "6 Clicks" Mobility   Outcome Measure  Help needed turning from  your back to your side while in a flat bed without using bedrails?: None Help needed moving from lying on your back to sitting on the side of a flat bed without using bedrails?: None Help needed moving to and from a bed to a chair (including a wheelchair)?: A Little Help needed  standing up from a chair using your arms (e.g., wheelchair or bedside chair)?: A Little Help needed to walk in hospital room?: A Little Help needed climbing 3-5 steps with a railing? : A Little 6 Click Score: 20    End of Session Equipment Utilized During Treatment: Gait belt Activity Tolerance: Patient tolerated treatment well Patient left: with nursing/sitter in room;in bed;with bed alarm set Nurse Communication: Mobility status PT Visit Diagnosis: Unsteadiness on feet (R26.81)     Time: 9323-5573 PT Time Calculation (min) (ACUTE ONLY): 24 min  Charges:  $Gait Training: 8-22 mins $Therapeutic Activity: 8-22 mins                     Key Cen B. Beverely Risen PT, DPT Acute Rehabilitation Services Pager [phone] Office [phone]    Elon Alas [facility] 04/07/2021, 4:37 PM

## 2021-04-07 NOTE — Progress Notes (Signed)
CARDIAC REHAB PHASE I   [phone] Cardiac Rehab RN in to see patient and attempt to ambulate/get up to chair. Sitter at bedside. Multiple attempts made to wake patient. Opens eyes to name calling after gentle touch. Patient able to state name and year. Falls back asleep immediately. Raised head of bed and turned lights on with no affect on alertness. Sitter states she will get patient up to chair once patient is more alert.  Imaad Reuss Hoover Brunette RN, BSN

## 2021-04-07 NOTE — Progress Notes (Signed)
Unable to complete admission documentation questions, patient is not A&O.  Bari Edward, RN

## 2021-04-07 NOTE — Progress Notes (Signed)
Pt unable to follow commands, pulling at telemetry wires, rambling speech, attempting to get OOB multiple times, and ambulated the halls stating that she was trying to get to her bedroom. For the patient and for the safety of others, the headstart belt was applied with demonstration & medications given (see MAR). Will continue to monitor.   Bari Edward, RN

## 2021-04-07 NOTE — Progress Notes (Signed)
Mobility Specialist Progress Note   04/07/21 1100  Mobility  Activity Ambulated in hall  Level of Assistance Minimal assist, patient does 75% or more  Assistive Device Four wheel walker  Distance Ambulated (ft) 375 ft  Mobility Ambulated with assistance in hallway  Mobility Response Tolerated well  Mobility performed by Mobility specialist  $Mobility charge 1 Mobility   Pt having no complaints and agreeable. Presented this morning more oriented and requiring min VC for spatial awareness and generalized focus. Left on EOB w/ sitter and call bell in reach.    Pre Mobility: 92 HR During Mobility: 112 HR Post Mobility: 95 HR  Frederico Hamman Mobility Specialist Phone Number [phone]

## 2021-04-08 ENCOUNTER — Other Ambulatory Visit (HOSPITAL_COMMUNITY): Payer: Self-pay

## 2021-04-08 ENCOUNTER — Inpatient Hospital Stay (HOSPITAL_COMMUNITY): Payer: Medicare Other

## 2021-04-08 DIAGNOSIS — I200 Unstable angina: Secondary | ICD-10-CM

## 2021-04-08 DIAGNOSIS — I2119 ST elevation (STEMI) myocardial infarction involving other coronary artery of inferior wall: Principal | ICD-10-CM

## 2021-04-08 LAB — CBC
HCT: 38 % (ref 36.0–46.0)
Hemoglobin: 12.9 g/dL (ref 12.0–15.0)
MCH: 32.5 pg (ref 26.0–34.0)
MCHC: 33.9 g/dL (ref 30.0–36.0)
MCV: 95.7 fL (ref 80.0–100.0)
Platelets: 196 10*3/uL (ref 150–400)
RBC: 3.97 MIL/uL (ref 3.87–5.11)
RDW: 12.7 % (ref 11.5–15.5)
WBC: 7.5 10*3/uL (ref 4.0–10.5)
nRBC: 0 % (ref 0.0–0.2)

## 2021-04-08 LAB — URINE CULTURE: Culture: >=100000 — AB

## 2021-04-08 MED ORDER — METOPROLOL SUCCINATE ER 50 MG PO TB24
50.0000 mg | ORAL_TABLET | Freq: Every day | ORAL | 1 refills | Status: AC
  Filled 2021-04-08: qty 30, 30d supply, fill #0

## 2021-04-08 MED ORDER — ATORVASTATIN CALCIUM 80 MG PO TABS
80.0000 mg | ORAL_TABLET | Freq: Every day | ORAL | 1 refills | Status: AC
  Filled 2021-04-08: qty 30, 30d supply, fill #0

## 2021-04-08 MED ORDER — SPIRONOLACTONE 25 MG PO TABS
12.5000 mg | ORAL_TABLET | Freq: Every day | ORAL | 0 refills | Status: AC
  Filled 2021-04-08: qty 30, 60d supply, fill #0

## 2021-04-08 MED ORDER — IOHEXOL 350 MG/ML SOLN
60.0000 mL | Freq: Once | INTRAVENOUS | Status: AC | PRN
  Administered 2021-04-08: 12:00:00 60 mL via INTRAVENOUS

## 2021-04-08 MED ORDER — TICAGRELOR 90 MG PO TABS
90.0000 mg | ORAL_TABLET | Freq: Two times a day (BID) | ORAL | 1 refills | Status: AC
  Filled 2021-04-08: qty 60, 30d supply, fill #0

## 2021-04-08 MED ORDER — ASPIRIN 81 MG PO TBEC
81.0000 mg | DELAYED_RELEASE_TABLET | Freq: Every day | ORAL | 1 refills | Status: AC
  Filled 2021-04-08: qty 30, 30d supply, fill #0

## 2021-04-08 NOTE — Progress Notes (Signed)
Reminders given to pt regarding Brilinta and walking with family present. Pt forgetful and distracted but sts her family will help with her meds. [phone] Ethelda Chick CES, ACSM 3:09 PM 04/08/2021

## 2021-04-08 NOTE — TOC Initial Note (Addendum)
Transition of Care [facility]) - Initial/Assessment Note    Patient Details  Name: Virginia Roberts MRN: [id number] Date of Birth: 1940/08/08  Transition [facility]) CM/SW Contact:    Gala Lewandowsky, RN Phone Number: 04/08/2021, 3:00 PM  Clinical Narrative:  Case Manager spoke with the patient and she would like to return home and is agreeable to home health services. Patient states her son does not live far from the patient and gave Case Manager verbal permission to call her son [name]. Case Manager did speak with son and he is agreeable to having patient return home. Son states that the patient will have supervision either it be at her home vs having the patient to come to his home. Son is agreeable to [facility] for services. Information submitted to intake @ [facility] to see if they can service the patient. Awaiting call back from the agency. Patient has a rolling walker in the home and son declined a shower chair. Medications will arrive from [facility]. Case Manager will continue to await [facility].                  1538 04-08-21 Case Manager received a call from [place] at [facility] and they can service the patient; however has to contact the PCP before the patient can be seen. Family is aware of HH delay and agreeable. Son to pick the patient up and transport home via private vehicle.   04-08-21 1619 [facility] did state that they can service the patient on tomorrow.  Expected Discharge Plan: Home w Home Health Services Barriers to Discharge: No Barriers Identified   Patient Goals and CMS Choice Patient states their goals for this hospitalization and ongoing recovery are:: to return home   Choice offered to / list presented to : Adult Children  Expected Discharge Plan and Services Expected Discharge Plan: Home w Home Health Services In-house Referral: Clinical Social Work Discharge Planning Services: CM Consult Post Acute Care Choice: Home Health Living  arrangements for the past 2 months: Single Family Home Expected Discharge Date: 04/08/21               DME Arranged: N/A DME Agency: NA       HH Arranged: PT, OT   Date HH Agency Contacted: 04/08/21 Time HH Agency Contacted: 1450 Representative spoke with at [facility] Agency: Intake  Prior Living Arrangements/Services Living arrangements for the past 2 months: Single Family Home Lives with:: Spouse (however, children will stay with patient for now.) Patient language and need for interpreter reviewed:: Yes Do you feel safe going back to the place where you live?: Yes      Need for Family Participation in Patient Care: Yes (Comment) Care giver support system in place?: Yes (comment) Current home services: DME (Patient has a rolling walker- son declined the shower seat.) Criminal Activity/Legal Involvement Pertinent to Current Situation/Hospitalization: No - Comment as needed    Permission Sought/Granted Permission sought to share information with : Facility Medical sales representative, Sports coach, Family Supports Permission granted to share information with : Yes, Verbal Permission Granted     Permission granted to share info w AGENCY: [facility] [employer].        Emotional Assessment Appearance:: Appears stated age Attitude/Demeanor/Rapport: Engaged Affect (typically observed): Appropriate Orientation: : Oriented to Self Alcohol / Substance Use: Not Applicable Psych Involvement: No (comment)  Admission diagnosis:  STEMI (ST elevation myocardial infarction) [facility]) [I21.3] Patient Active Problem List   Diagnosis Date Noted   Acute  encephalopathy 04/07/2021   Unstable angina [facility])    STEMI involving oth coronary artery of inferior wall (HCC) 04/01/2021   STEMI (ST elevation myocardial infarction) (HCC) 04/01/2021   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 [place] [address] Phone: [phone] Fax: [phone]     Social Determinants of Health (SDOH) Interventions    Readmission Risk Interventions No flowsheet data found.

## 2021-04-08 NOTE — Discharge Summary (Signed)
Physician Discharge Summary  Virginia Roberts DTO:[id number] DOB: 1940-06-22 DOA: 04/01/2021  PCP: Gaspar Skeeters, MD  Admit date: 04/01/2021 Discharge date: 04/08/2021  Admitted From: Home Disposition: Home   Recommendations for Outpatient Follow-up:  Follow up with PCP in 1-2 weeks Needs thyroid ultrasound to investigate nodules seen incidentally on CT (see below). TSH wnl.  Follow up with cardiology for post discharge PCI care. Given option of local follow up vs. returning to [facility] for ongoing care.   Home Health: PT, OT (will need to be prescribed by PCP in [place]) Equipment/Devices: Rolling walker at home Discharge Condition: Stable CODE STATUS: Full Diet recommendation: Heart healthy  Brief/Interim Summary: Virginia Roberts is an 80 y.o. female with history of HTN who presented to [facility] with STEMI and was admitted by cardiology services on 04/01/2021. She was directly sent to cath lab where occlusion of the mid circumflex was found and treated successfully with primary PCI. 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 Post MI therapy done and staged PCI to RCA was completed 04/04/21. Echo also showed systolic CHF with EF 45-50% and severe valvular disease. On the night of 04/04/21 she started to have some agitation and confusion. UA and culture were obtained and came back positive for E. coli. she was started on ceftriaxone with improvement, though continued to have waxing/waning delirium. The benefit of hospitalization has been maximized and the patient is stable per cardiology for discharge. Please see detail below.   Discharge Diagnoses:  Principal Problem:   STEMI (ST elevation myocardial infarction) (HCC) Active Problems:   STEMI involving oth coronary artery of inferior wall (HCC)   Unstable angina (HCC)   Acute encephalopathy  Inferior lateral STEMI: Underwent cardiac cath 12/16 showing occluded circumflex treated with PCI/DES x1.  Now  on DAPT with aspirin/Brilinta.  Did have  residual disease in the RCA which was treated in a staged fashion on 12/19 with PCI/DES x1.  Of note did have significant tortuosity in the right radial site, therefore was recommended to go left radial for repeat cath.  No recurrent chest pain. - Continue ASA, brilinta x12 months.  - Rx statin, beta blocker.    Acute HFrEF: Echo showed LVEF of 45 to 50%.  She did have elevated LVEDP on cath and treated with IV Lasix.  Remains net -6.9 L - Per cardiology: Changed atenolol to metoprolol succinate, doesn't require regular diuretic. Started low dose spironolactone.  - F/u in cardiology clinic for continued titration/GDMT.     Hyperlipidemia: Starting statin therapy. LDL 92.    Valvular disease: Echo 12/16 with severe mitral regurgitation, severe TR as well as moderate to severe aortic regurgitation. - Cardiology follow up recommended   Altered mental status/UTI: Developed confusion that has been waxing/waning, worse at night, and only began in hospital consistent with hospital delirium complicated by E. coli UTI which was treated with ceftriaxone (proven sensitive on culture) while hospitalized with improvement in mentation. PT/OT considered SNF at discharge, though this was mostly for mental reasons and the patient/family feel safe taking her home which will be better for her mental status. Home health therapies arranged.   CXR abnormality: CT chest was recommended, showing dilated pulmonary arteries as cause of abnormality without suspicious findings.   Incidental thyroid nodules (see CT chest report below) measuring up to 1.7cm. TSH was checked as part of AMS work up and was wnl at 2.532.  - Thyroid ultrasound recommended as outpatient. This was conveyed to patient and son verbally and in writing.  Discharge Instructions  Discharge Instructions     Amb Referral to Cardiac Rehabilitation   Complete by: As directed    Diagnosis:  Coronary Stents NSTEMI     After initial evaluation and  assessments completed: Virtual Based Care may be provided alone or in conjunction with Phase 2 Cardiac Rehab based on patient barriers.: Yes   Diet - low sodium heart healthy   Complete by: As directed    Discharge instructions   Complete by: As directed    You were admitted and treated for a heart attack, a STEMI, and had stents placed. You had evidence of a urinary tract infection that was adequately treated while you were in the hospital (last antibiotic dose was today) which caused some confusion. This confusion has improved and will likely completely resolve once you return home. Home health services are being arranged for you at discharge. You are stable for discharge with the following recommendations:  - Follow up with your primary doctor in the next 1-2 weeks for hospital follow up and to arrange for a thyroid ultrasound to investigate nodules seen on a CT scan. - Follow up with cardiology. You are welcome to follow up near home, but contact information for our cardiology clinic is included. You are absolutely welcome to return here for ongoing care.  - Medications to reduce your risk of the stents becoming clogged: Take aspirin daily and brilinta twice daily. You should continue these for at least 12 months.  - Take atorvastatin to lower cholesterol and reduce risk of another heart attack as well.  - STOP taking norvasc and atenolol. These were replaced with spironolactone and metoprolol which are better for your heart function. - Of course, seek medical attention right away if your symptoms return.      Allergies as of 04/08/2021       Reactions   Metoprolol Hives   Cantil [mepenzolate] Hives   Diltiazem Nausea And Vomiting, Other (See Comments)   Dizziness/vertigo   Erythromycin Other (See Comments)   Stomach cramps   Versed [midazolam] Other (See Comments)   Confusion/agitation x2 days after receiving versed.         Medication List     STOP taking these medications     amLODipine 2.5 MG tablet Commonly known as: NORVASC   atenolol 25 MG tablet Commonly known as: TENORMIN   oxybutynin 10 MG 24 hr tablet Commonly known as: DITROPAN-XL   Potassium 99 MG Tabs       TAKE these medications    alendronate 70 MG tablet Commonly known as: FOSAMAX Take 70 mg by mouth once a week. Take with a full glass of water on an empty stomach.   ALIGN PREBIOTIC-PROBIOTIC PO Take 1 capsule by mouth daily.   ALPRAZolam 1 MG tablet Commonly known as: XANAX Take 1 mg by mouth at bedtime.   aspirin 81 MG EC tablet Take 1 tablet (81 mg total) by mouth daily. Swallow whole. Start taking on: April 09, 2021   atorvastatin 80 MG tablet Commonly known as: LIPITOR Take 1 tablet (80 mg total) by mouth daily. Start taking on: April 09, 2021   BENEFIBER DRINK MIX PO Take 2 Scoops by mouth daily.   Calcium Carb-Cholecalciferol 600-20 MG-MCG Tabs Take 1 tablet by mouth 2 (two) times daily.   Coenzyme Q10 100 MG capsule Take 100 mg by mouth daily.   Cranberry 500 MG Tabs Take 500 mg by mouth daily.   dicyclomine 10 MG capsule Commonly known as: BENTYL Take 10  mg by mouth 2 (two) times daily.   dimenhyDRINATE 50 MG tablet Commonly known as: DRAMAMINE Take 50 mg by mouth every 8 (eight) hours as needed for nausea.   folic acid 800 MCG tablet Commonly known as: FOLVITE Take 400 mcg by mouth daily.   Lipoflavonoid Tabs Take 2 tablets by mouth daily as needed (tinnitus).   melatonin 5 MG Tabs Take 5 mg by mouth at bedtime as needed (sleep).   metoprolol succinate 50 MG 24 hr tablet Commonly known as: TOPROL-XL Take 1 tablet (50 mg total) by mouth daily. Start taking on: April 09, 2021   Omega-3 Fish Oil 300 MG Caps Take 1 capsule by mouth daily.   PreserVision AREDS 2+Multi Vit Caps Take 1 capsule by mouth 2 (two) times daily.   pyridOXINE 100 MG tablet Commonly known as: VITAMIN B-6 Take 100 mg by mouth daily.   spironolactone 25  MG tablet Commonly known as: ALDACTONE Take 0.5 tablets (12.5 mg total) by mouth daily. Start taking on: April 09, 2021   ticagrelor 90 MG Tabs tablet Commonly known as: BRILINTA Take 1 tablet (90 mg total) by mouth 2 (two) times daily.   triamcinolone cream 0.1 % Commonly known as: KENALOG Apply 1 application topically 2 (two) times daily as needed (rash).   VITAMIN B12 SL Place 1,200 mcg under the tongue daily.   vitamin C 1000 MG tablet Take 1,000 mg by mouth daily.        Allergies  Allergen Reactions   Metoprolol Hives   Cantil [Mepenzolate] Hives   Diltiazem Nausea And Vomiting and Other (See Comments)    Dizziness/vertigo   Erythromycin Other (See Comments)    Stomach cramps   Versed [Midazolam] Other (See Comments)    Confusion/agitation x2 days after receiving versed.     Consultations: Cardiology   Procedures/Studies: DG Chest 2 View  Result Date: 04/07/2021 CLINICAL DATA:  Altered mental status EXAM: CHEST - 2 VIEW COMPARISON:  Chest radiograph 04/01/2021 FINDINGS: The heart is enlarged, unchanged. The upper mediastinal contours are stable. There is a masslike opacity in the right perihilar region measuring up to 2.2 cm. Additionally, asymmetric density is again seen in the right apex. There is no pulmonary edema. There is no focal consolidation. There is no pleural effusion or pneumothorax. There is no acute osseous abnormality. IMPRESSION: Masslike opacity in the right apex as well as a discrete nodular opacity in the right perihilar region, for which CT chest (preferably with contrast) is recommended for further evaluation. Electronically Signed   By: Lesia Hausen M.D.   On: 04/07/2021 16:58   CT HEAD WO CONTRAST ( )  Result Date: 04/08/2021 CLINICAL DATA:  Neuro deficit, acute, stroke suspected Mental status change, unknown cause. EXAM: CT HEAD WITHOUT CONTRAST TECHNIQUE: Contiguous axial images were obtained from the base of the skull through the  vertex without intravenous contrast. COMPARISON:  None. FINDINGS: Brain: There is atrophy and chronic small vessel disease changes. No acute intracranial abnormality. Specifically, no hemorrhage, hydrocephalus, mass lesion, acute infarction, or significant intracranial injury. Vascular: No hyperdense vessel or unexpected calcification. Skull: No acute calvarial abnormality. Sinuses/Orbits: No acute findings Other: None IMPRESSION: Atrophy, chronic microvascular disease. No acute intracranial abnormality. Electronically Signed   By: Charlett Nose M.D.   On: 04/08/2021 12:15   CT CHEST W CONTRAST  Result Date: 04/08/2021 CLINICAL DATA:  Abnormal chest x-ray. EXAM: CT CHEST WITH CONTRAST TECHNIQUE: Multidetector CT imaging of the chest was performed during intravenous contrast administration. CONTRAST:  60mL OMNIPAQUE IOHEXOL 350 MG/ML SOLN COMPARISON:  Chest x-ray 04/07/2021 FINDINGS: Cardiovascular: Cardiomegaly. Extensive coronary artery and scattered aortic calcifications. Tortuous aorta. No evidence of aneurysm. While the main pulmonary artery is not significantly dilated, measuring 3.3 cm, the central right and left pulmonary arteries are prominent suggesting pulmonary arterial hypertension. The right perihilar density seen on prior chest x-ray likely related to dilated right pulmonary artery. Mediastinum/Nodes: No mediastinal, hilar, or axillary adenopathy. Trachea and esophagus are unremarkable. Numerous nodules throughout the right thyroid lobe measuring up to 1.7 cm. Lungs/Pleura: Small left pleural effusion. Bibasilar atelectasis. Biapical scarring. No suspicious pulmonary nodules in the right apex or right hilar region as questioned on plain film. Upper Abdomen: Imaging into the upper abdomen demonstrates no acute findings. Musculoskeletal: Chest wall soft tissues are unremarkable. No acute bony abnormality. IMPRESSION: Coronary artery disease.  Cardiomegaly. Dilated central pulmonary arteries  bilaterally compatible with pulmonary arterial hypertension. This corresponds to the density in the right hilar region. Small left pleural effusion.  Bibasilar atelectasis. No suspicious pulmonary nodules. Numerous thyroid nodules measuring up to 1.7 cm. Recommend thyroid US (ref: J Am Coll Radiol. 2015 Feb;12(2): 143-50). Aortic Atherosclerosis (ICD10-I70.0). Electronically Signed   By: Charlett Nose M.D.   On: 04/08/2021 12:14   CARDIAC CATHETERIZATION  Result Date: 04/04/2021   Mid RCA lesion is 75% stenosed.   A drug-eluting stent was successfully placed using a STENT ONYX FRONTIER 2.75X15.   Post intervention, there is a 0% residual stenosis. Severe mid RCA stenosis Successful PTCA/DES x 1 mid RCA Recommendations: DAPT with ASA/Brilinta for 12 months if tolerated. Continue beta blocker and statin.   CARDIAC CATHETERIZATION  Result Date: 04/04/2021   Mid RCA lesion is 75% stenosed.   Mid Cx lesion is 100% stenosed.   2nd Mrg lesion is 100% stenosed.   Prox LAD to Mid LAD lesion is 40% stenosed.   A drug-eluting stent was successfully placed using a STENT ONYX FRONTIER 3.0X26.   Post intervention, there is a 0% residual stenosis. Acute inferoposterior STEMI secondary to occlusion of the mid circumflex, treated successfully with primary PCI using a 3.0x26 mm Onyx Frontier DES. Residual downstream embolus/thrombus results in occluded OM sub-branch Severe mid-RCA stenosis of 75% Nonobstructive left main and LAD plaquing without severe stenoses Moderately elevated LVEDP Plan: post-MI medical therapy, check 2D echo, recommend staged PCI of the RCA Monday if no complications. **for future cath do NOT use right radial artery**   DG CHEST PORT 1 VIEW  Result Date: 04/01/2021 CLINICAL DATA:  Edema EXAM: PORTABLE CHEST 1 VIEW COMPARISON:  Report 10/07/2011 FINDINGS: Cardiomegaly with vascular congestion. Diffuse bilateral interstitial and ground-glass opacity, suspicious for pulmonary edema. Probable tiny  pleural effusions. Asymmetric right apical opacity. IMPRESSION: 1. Cardiomegaly with vascular congestion and diffuse bilateral interstitial and ground-glass opacity, suspect for pulmonary edema. Trace pleural effusion 2. Asymmetrical masslike right apical opacity, concerning for lung mass. Recommend chest CT for further evaluation These results will be called to the ordering clinician or representative by the Radiologist Assistant, and communication documented in the PACS or [employer]. Electronically Signed   By: Jasmine Pang M.D.   On: 04/01/2021 20:36   ECHOCARDIOGRAM COMPLETE  Result Date: 04/01/2021    ECHOCARDIOGRAM REPORT   Patient Name:   Virginia Roberts Date of Exam: 04/01/2021 Medical Rec #:  [id number]      Height:       60.0 in Accession #:    [id number]     Weight:  115.0 lb Date of Birth:  07-Oct-1940      BSA:          1.475 m Patient Age:    80 years       BP:           99/88 mmHg Patient Gender: F              HR:           81 bpm. Exam Location:  Inpatient Procedure: 2D Echo, Cardiac Doppler and Color Doppler Indications:    Chest pain  History:        Patient has no prior history of Echocardiogram examinations.                 Acute MI; Risk Factors:Hypertension. Hx DVT. S/P PCI.  Sonographer:    Ross Ludwig RDCS (AE) Referring Phys: [id number] MICHAEL COOPER IMPRESSIONS  1. Left ventricular ejection fraction, by estimation, is 45 to 50%. The left ventricle has mildly decreased function. The left ventricle demonstrates global hypokinesis. There is mild left ventricular hypertrophy of the basal segment.  2. Right ventricular systolic function is normal. The right ventricular size is moderately enlarged. There is severely elevated pulmonary artery systolic pressure.  3. Left atrial size was severely dilated.  4. Right atrial size was severely dilated.  5. Severe mitral valve regurgitation.  6. Tricuspid valve regurgitation is severe.  7. Aortic valve regurgitation is moderate to severe.  8.  Pulmonic valve regurgitation is moderate to severe. FINDINGS  Left Ventricle: Left ventricular ejection fraction, by estimation, is 45 to 50%. The left ventricle has mildly decreased function. The left ventricle demonstrates global hypokinesis. The left ventricular internal cavity size was normal in size. There is  mild left ventricular hypertrophy of the basal segment. Right Ventricle: The right ventricular size is moderately enlarged. No increase in right ventricular wall thickness. Right ventricular systolic function is normal. There is severely elevated pulmonary artery systolic pressure. The tricuspid regurgitant velocity is 3.58 m/s, and with an assumed right atrial pressure of 15 mmHg, the estimated right ventricular systolic pressure is 66.3 mmHg. Left Atrium: Left atrial size was severely dilated. Right Atrium: Right atrial size was severely dilated. Pericardium: There is no evidence of pericardial effusion. Mitral Valve: There is moderate calcification of the mitral valve leaflet(s). Severe mitral valve regurgitation. 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 Aortic Valve: Aortic valve regurgitation is moderate to severe. Aortic regurgitation PHT measures 322 msec. Aortic valve mean gradient measures 3.0 mmHg. Aortic valve peak gradient measures 6.2 mmHg. Aortic valve area, by VTI measures 3.05 cm. Pulmonic Valve: Pulmonic valve regurgitation is moderate to severe.  LEFT VENTRICLE PLAX 2D LVIDd:         4.80 cm LVIDs:         3.70 cm LV PW:         1.10 cm LV IVS:        1.20 cm LVOT diam:     2.00 cm LV SV:         77 LV SV Index:   52 LVOT Area:     3.14 cm  RIGHT VENTRICLE             IVC RV Basal diam:  4.70 cm     IVC diam: 2.50 cm RV Mid diam:    3.40 cm RV S prime:     15.30 cm/s TAPSE (M-mode): 2.5 cm LEFT ATRIUM  Index        RIGHT ATRIUM           Index LA diam:        4.20 cm 2.85 cm/m   RA Area:     15.80 cm LA Vol (A2C):   74.9 ml 50.77 ml/m  RA Volume:   41.00  ml  27.79 ml/m LA Vol (A4C):   77.7 ml 52.66 ml/m LA Biplane Vol: 84.1 ml 57.00 ml/m  AORTIC VALVE AV Area (Vmax):    2.79 cm AV Area (Vmean):   2.73 cm AV Area (VTI):     3.05 cm AV Vmax:           124.00 cm/s AV Vmean:          86.500 cm/s AV VTI:            0.252 m AV Peak Grad:      6.2 mmHg AV Mean Grad:      3.0 mmHg LVOT Vmax:         110.00 cm/s LVOT Vmean:        75.100 cm/s LVOT VTI:          0.245 m LVOT/AV VTI ratio: 0.97 AI PHT:            322 msec  AORTA Ao Root diam: 3.60 cm Ao Asc diam:  3.50 cm MR Peak grad:    104.9 mmHg   TRICUSPID VALVE MR Mean grad:    68.0 mmHg    TR Peak grad:   51.3 mmHg MR Vmax:         512.00 cm/s  TR Vmax:        358.00 cm/s MR Vmean:        384.0 cm/s MR PISA:         3.08 cm     SHUNTS MR PISA Eff ROA: 19 mm       Systemic VTI:  0.24 m MR PISA Radius:  0.70 cm      Systemic Diam: 2.00 cm Carolan Clines Electronically signed by Carolan Clines Signature Date/Time: 04/01/2021/7:02:41 PM    Final     Subjective: Feels well. No chest pain or dyspnea. Able to convey nearly a full history this morning.   Discharge Exam: Vitals:   04/08/21 0835 04/08/21 1222  BP: 132/89 122/75  Pulse: 80 94  Resp:  19  Temp:  97.6 F (36.4 C)  SpO2:  96%   General: Pt is alert, awake, not in acute distress Cardiovascular: RRR, S1/S2 +, no rubs, no gallops Respiratory: CTA bilaterally, no wheezing, no rhonchi Abdominal: Soft, NT, ND, bowel sounds + Extremities: No edema, no cyanosis  Labs: BNP (last 3 results) Recent Labs    04/01/21 1351  BNP 982.3*   Basic Metabolic Panel: Recent Labs  Lab 04/02/21 0243 04/03/21 0243 04/05/21 0839 04/05/21 1109 04/06/21 0208 04/07/21 0432  NA 137 137 133*  --  134* 137  K 3.5 3.5 2.9*  --  3.6 3.7  CL 100 100 98  --  99 104  CO2 --  25 22  GLUCOSE 158* 135* 113*  --  134* 103*  BUN --  19 19  CREATININE 0.94 1.08* 0.96  --  0.96 0.77  CALCIUM 8.9 8.5* 8.2*  --  8.9 8.8*  MG 2.3 1.9  --  1.9  --    --    Liver Function Tests: No results for input(s):  AST, ALT, ALKPHOS, BILITOT, PROT, ALBUMIN in the last 168 hours. No results for input(s): LIPASE, AMYLASE in the last 168 hours. Recent Labs  Lab 04/07/21 1522  AMMONIA 29   CBC: Recent Labs  Lab 04/02/21 0053 04/02/21 0243 04/03/21 0243 04/07/21 0856 04/08/21 0138  WBC  --  8.6 9.6 8.0 7.5  HGB 14.3 14.3 12.2 13.2 12.9  HCT 42.0 42.2 37.1 37.8 38.0  MCV  --  97.5 98.4 95.2 95.7  PLT  --  198 147* 204 196   Cardiac Enzymes: No results for input(s): CKTOTAL, CKMB, CKMBINDEX, TROPONINI in the last 168 hours. BNP: Invalid input(s): POCBNP CBG: Recent Labs  Lab 04/02/21 2230  GLUCAP 170*   D-Dimer No results for input(s): DDIMER in the last 72 hours. Hgb A1c No results for input(s): HGBA1C in the last 72 hours. Lipid Profile No results for input(s): CHOL, HDL, LDLCALC, TRIG, CHOLHDL, LDLDIRECT in the last 72 hours. Thyroid function studies Recent Labs    04/07/21 1522  TSH 2.532   Anemia work up Recent Labs    04/07/21 1522  VITAMINB12 1,736*   Urinalysis    Component Value Date/Time   COLORURINE AMBER (A) 04/05/2021 1807   APPEARANCEUR CLOUDY (A) 04/05/2021 1807   LABSPEC 1.021 04/05/2021 1807   PHURINE 5.0 04/05/2021 1807   GLUCOSEU NEGATIVE 04/05/2021 1807   HGBUR LARGE (A) 04/05/2021 1807   BILIRUBINUR NEGATIVE 04/05/2021 1807   KETONESUR 5 (A) 04/05/2021 1807   PROTEINUR 100 (A) 04/05/2021 1807   NITRITE NEGATIVE 04/05/2021 1807   LEUKOCYTESUR LARGE (A) 04/05/2021 1807    Microbiology Recent Results (from the past 240 hour(s))  Resp Panel by RT-PCR (Flu A&B, Covid) Nasopharyngeal Swab     Status: None   Collection Time: 04/01/21  1:11 PM   Specimen: Nasopharyngeal Swab; Nasopharyngeal(NP) swabs in vial transport medium  Result Value Ref Range Status   SARS Coronavirus 2 by RT PCR NEGATIVE NEGATIVE Final    Comment: (NOTE) SARS-CoV-2 target nucleic acids are NOT DETECTED.  The  SARS-CoV-2 RNA is generally detectable in upper respiratory specimens during the acute phase of infection. The lowest concentration of SARS-CoV-2 viral copies this assay can detect is 138 copies/mL. A negative result does not preclude SARS-Cov-2 infection and should not be used as the sole basis for treatment or other patient management decisions. A negative result may occur with  improper specimen collection/handling, submission of specimen other than nasopharyngeal swab, presence of viral mutation(s) within the areas targeted by this assay, and inadequate number of viral copies(<138 copies/mL). A negative result must be combined with clinical observations, patient history, and epidemiological information. The expected result is Negative.  Fact Sheet for Patients:  BloggerCourse.com  Fact Sheet for Healthcare Providers:  SeriousBroker.it  This test is no t yet approved or cleared by the [place] FDA and  has been authorized for detection and/or diagnosis of SARS-CoV-2 by FDA under an Emergency Use Authorization (EUA). This EUA will remain  in effect (meaning this test can be used) for the duration of the COVID-19 declaration under Section 564(b)(1) of the Act, 21 U.S.C.section 360bbb-3(b)(1), unless the authorization is terminated  or revoked sooner.       Influenza A by PCR NEGATIVE NEGATIVE Final   Influenza B by PCR NEGATIVE NEGATIVE Final    Comment: (NOTE) The Xpert Xpress SARS-CoV-2/FLU/RSV plus assay is intended as an aid in the diagnosis of influenza from Nasopharyngeal swab specimens and should not be used as a sole basis for  treatment. Nasal washings and aspirates are unacceptable for Xpert Xpress SARS-CoV-2/FLU/RSV testing.  Fact Sheet for Patients: BloggerCourse.com  Fact Sheet for Healthcare Providers: SeriousBroker.it  This test is not yet approved or  cleared by the [place] FDA and has been authorized for detection and/or diagnosis of SARS-CoV-2 by FDA under an Emergency Use Authorization (EUA). This EUA will remain in effect (meaning this test can be used) for the duration of the COVID-19 declaration under Section 564(b)(1) of the Act, 21 U.S.C. section 360bbb-3(b)(1), unless the authorization is terminated or revoked.  Performed at [facility], [address] [place], [place] [address]   MRSA Next Gen by PCR, Nasal     Status: None   Collection Time: 04/01/21  4:40 PM   Specimen: Nasal Mucosa; Nasal Swab  Result Value Ref Range Status   MRSA by PCR Next Gen NOT DETECTED NOT DETECTED Final    Comment: (NOTE) The GeneXpert MRSA Assay (FDA approved for NASAL specimens only), is one component of a comprehensive MRSA colonization surveillance program. It is not intended to diagnose MRSA infection nor to guide or monitor treatment for MRSA infections. Test performance is not FDA approved in patients less than 45 years old. Performed at [facility], [address] [address], [place], [place] [address]   Urine Culture     Status: Abnormal   Collection Time: 04/05/21  6:07 PM   Specimen: Urine, Clean Catch  Result Value Ref Range Status   Specimen Description URINE, CLEAN CATCH  Final   Special Requests   Final    NONE Performed at [facility], [address] [place], [place] [address]    Culture >=100,000 COLONIES/mL ESCHERICHIA COLI (A)  Final   Report Status 04/08/2021 FINAL  Final   Organism ID, Bacteria ESCHERICHIA COLI (A)  Final      Susceptibility   Escherichia coli - MIC*    AMPICILLIN >=32 RESISTANT Resistant     CEFAZOLIN 16 SENSITIVE Sensitive     CEFEPIME <=0.12 SENSITIVE Sensitive     CEFTRIAXONE <=0.25 SENSITIVE Sensitive     CIPROFLOXACIN <=0.25 SENSITIVE Sensitive     GENTAMICIN <=1 SENSITIVE Sensitive     IMIPENEM <=0.25 SENSITIVE Sensitive     NITROFURANTOIN <=16 SENSITIVE Sensitive      TRIMETH/SULFA <=20 SENSITIVE Sensitive     AMPICILLIN/SULBACTAM >=32 RESISTANT Resistant     PIP/TAZO <=4 SENSITIVE Sensitive     * >=100,000 COLONIES/mL ESCHERICHIA COLI  Culture, blood (routine x 2)     Status: None (Preliminary result)   Collection Time: 04/07/21  3:22 PM   Specimen: BLOOD LEFT HAND  Result Value Ref Range Status   Specimen Description BLOOD LEFT HAND  Final   Special Requests   Final    BOTTLES DRAWN AEROBIC ONLY Blood Culture results may not be optimal due to an inadequate volume of blood received in culture bottles   Culture   Final    NO GROWTH < 24 HOURS Performed at [facility], [address] [place], [place] [address]    Report Status PENDING  Incomplete  Culture, blood (routine x 2)     Status: None (Preliminary result)   Collection Time: 04/07/21  3:35 PM   Specimen: BLOOD RIGHT HAND  Result Value Ref Range Status   Specimen Description BLOOD RIGHT HAND  Final   Special Requests   Final    BOTTLES DRAWN AEROBIC ONLY Blood Culture results may not be optimal due to  an inadequate volume of blood received in culture bottles   Culture   Final    NO GROWTH < 24 HOURS Performed at [facility], [address] [place], [place] [address]    Report Status PENDING  Incomplete    Time coordinating discharge: Approximately 40 minutes  Tyrone Nine, MD  [facility] 04/08/2021, 3:04 PM

## 2021-04-08 NOTE — Care Management Important Message (Signed)
Important Message  Patient Details  Name: Virginia Roberts MRN: [id number] Date of Birth: 1941/03/31   Medicare Important Message Given:  Yes     Renie Ora 04/08/2021, 8:55 AM

## 2021-04-12 LAB — CULTURE, BLOOD (ROUTINE X 2)
Culture: NO GROWTH
Culture: NO GROWTH

## 2021-04-20 ENCOUNTER — Other Ambulatory Visit (HOSPITAL_COMMUNITY): Payer: Self-pay

## 2021-04-20 ENCOUNTER — Telehealth (HOSPITAL_COMMUNITY): Payer: Self-pay

## 2021-04-20 NOTE — Telephone Encounter (Signed)
Pharmacy Transitions of Care Follow-up Telephone Call  Date of discharge: 04/08/21  Discharge Diagnosis: STEMI  How have you been since you were released from the hospital? Patient doing well since discharge, no questions about meds at this time.   Medication changes made at discharge:     START taking: Aspirin Low Dose (aspirin)  atorvastatin (LIPITOR)  Brilinta (ticagrelor)  metoprolol succinate (TOPROL-XL)  spironolactone (ALDACTONE)  STOP taking: amLODipine 2.5 MG tablet (NORVASC)  atenolol 25 MG tablet (TENORMIN)  oxybutynin 10 MG 24 hr tablet (DITROPAN-XL)  Potassium 99 MG Tabs   Medication changes verified by the patient? Yes    Medication Accessibility:  Home Pharmacy:  [facility]  Was the patient provided with refills on discharged medications? Yes   Have all prescriptions been transferred from [facility] to home pharmacy? Yes   Is the patient able to afford medications? Has insurance    Medication Review:  Patietn verified staff went over medication with her before discharge. Was unable to stay on phone long enough to recap patietn counseling for Brilinta. TICAGRELOR (BRILINTA) Ticagrelor 90 mg BID initiated on 04/08/21.   Follow-up Appointments:  PCP Hospital f/u appt confirmed? Scheduled to see PCP next week  Specialist Hospital f/u appt confirmed? Scheduled to see Dr. Truddie Crumble on 09/01/21 @ 10:00am.   If their condition worsens, is the pt aware to call PCP or go to the Emergency Dept.? yes  Final Patient Assessment: Patient has f/u scheduled and refills at home pharmacy

## 2021-05-05 ENCOUNTER — Encounter (HOSPITAL_COMMUNITY): Payer: Self-pay | Admitting: Radiology

## 2021-06-03 ENCOUNTER — Other Ambulatory Visit: Payer: Self-pay

## 2021-06-03 ENCOUNTER — Ambulatory Visit (INDEPENDENT_AMBULATORY_CARE_PROVIDER_SITE_OTHER): Payer: Medicare Other | Admitting: Cardiovascular Disease

## 2021-06-03 ENCOUNTER — Encounter: Payer: Self-pay | Admitting: Cardiovascular Disease

## 2021-06-03 DIAGNOSIS — I10 Essential (primary) hypertension: Secondary | ICD-10-CM

## 2021-06-03 DIAGNOSIS — I2119 ST elevation (STEMI) myocardial infarction involving other coronary artery of inferior wall: Secondary | ICD-10-CM

## 2021-06-03 DIAGNOSIS — E782 Mixed hyperlipidemia: Secondary | ICD-10-CM

## 2021-06-03 DIAGNOSIS — G600 Hereditary motor and sensory neuropathy: Secondary | ICD-10-CM

## 2021-06-03 DIAGNOSIS — E785 Hyperlipidemia, unspecified: Secondary | ICD-10-CM | POA: Insufficient documentation

## 2021-06-03 DIAGNOSIS — I255 Ischemic cardiomyopathy: Secondary | ICD-10-CM | POA: Diagnosis not present

## 2021-06-03 DIAGNOSIS — I340 Nonrheumatic mitral (valve) insufficiency: Secondary | ICD-10-CM | POA: Diagnosis not present

## 2021-06-03 NOTE — Assessment & Plan Note (Signed)
Ms. Virginia Roberts had an inferolateral STEMI 04/01/2021.  She was taken to the Cath Lab by Dr. 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  She had residual RCA disease which underwent staged intervention by Dr. Clifton James 3 days later.  She was discharged home on dual antiplatelet therapy.  She denies chest pain but does complain of shortness of breath.

## 2021-06-03 NOTE — Patient Instructions (Signed)
Medication Instructions:  Your physician recommends that you continue on your current medications as directed. Please refer to the Current Medication list given to you today.  *If you need a refill on your cardiac medications before your next appointment, please call your pharmacy*   Lab Work: Your physician recommends that you return for lab work in: in the next week or 2 for FASTING lipid/liver profile.  If you have labs (blood work) drawn today and your tests are completely normal, you will receive your results only by: MyChart Message (if you have MyChart) OR A paper copy in the mail If you have any lab test that is abnormal or we need to change your treatment, we will call you to review the results.   Testing/Procedures: Your physician has requested that you have an echocardiogram. Echocardiography is a painless test that uses sound waves to create images of your heart. It provides your doctor with information about the size and shape of your heart and how well your hearts chambers and valves are working. This procedure takes approximately one hour. There are no restrictions for this procedure.  To be done in April. This procedure is done at [address]   Follow-Up: At [facility], you and your health needs are our priority.  As part of our continuing mission to provide you with exceptional heart care, we have created designated Provider Care Teams.  These Care Teams include your primary Cardiologist (physician) and Advanced Practice Providers (APPs -  Physician Assistants and Nurse Practitioners) who all work together to provide you with the care you need, when you need it.  We recommend signing up for the patient portal called "MyChart".  Sign up information is provided on this After Visit Summary.  MyChart is used to connect with patients for Virtual Visits (Telemedicine).  Patients are able to view lab/test results, encounter notes, upcoming appointments, etc.   Non-urgent messages can be sent to your provider as well.   To learn more about what you can do with MyChart, go to [email or web address].    Your next appointment:   6 month(s)  The format for your next appointment:   In Person  Provider:   Edd Fabian, FNP, Micah Flesher, PA-C, Marjie Skiff, PA-C, Juanda Crumble, PA-C, Joni Reining, DNP, ANP, or Azalee Course, PA-C       Then, Nanetta Batty, MD will plan to see you again in 12 month(s).

## 2021-06-03 NOTE — Assessment & Plan Note (Signed)
History of hyperlipidemia on high-dose statin therapy.  We will recheck a lipid liver profile. 

## 2021-06-03 NOTE — Assessment & Plan Note (Signed)
2D echo at the time of her last hospitalization in December showed severe MR, TR and moderate to severe AI.

## 2021-06-03 NOTE — Progress Notes (Signed)
06/03/2021 Lovenia Kim   November 18, 1940  [id number]  Primary Physician Leone Haven, MD Primary Cardiologist: Lorretta Harp MD Lupe Carney, [place]  HPI:  Virginia Roberts is a 81 y.o. 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  She is being sent back to me by Dolan Amen, PA-C in [place] to be established after her recent hospitalization.  She is accompanied by her husband Lavone Neri today.  She has a history of treated hypertension, and hyperlipidemia.  She never smoked.  She had an inferolateral STEMI 04/01/2021 while visiting her husband in a nursing facility.  She had urgent cath by Dr. Burt Knack revealing an occluded circumflex root which was stented.  She had residual RCA disease which underwent staged intervention by Dr. Angelena Form 3 days later.  A 2D echo at the time of hospitalization revealed an EF of 45 to 50% with severe MR, severe TR and AI.  She is on dual antiplatelet therapy.  She does complain of shortness of shortness of breath but denies chest pain.   Current Meds  Medication Sig   Ascorbic Acid (VITAMIN C) 1000 MG tablet Take 1,000 mg by mouth daily.   aspirin 81 MG EC tablet Take 1 tablet (81 mg total) by mouth daily. Swallow whole.   atorvastatin (LIPITOR) 80 MG tablet Take 1 tablet (80 mg total) by mouth daily.   Bacillus Coagulans-Inulin (ALIGN PREBIOTIC-PROBIOTIC PO) Take 1 capsule by mouth daily.   Calcium Carb-Cholecalciferol 600-20 MG-MCG TABS Take 1 tablet by mouth 2 (two) times daily.   Coenzyme Q10 100 MG capsule Take 100 mg by mouth daily.   Cranberry 500 MG TABS Take 500 mg by mouth daily.   Cyanocobalamin (VITAMIN B12 SL) Place 1,200 mcg under the tongue daily.   dicyclomine (BENTYL) 10 MG capsule Take 10 mg by mouth 2 (two) times daily.   dimenhyDRINATE (DRAMAMINE) 50 MG tablet Take 50  mg by mouth every 8 (eight) hours as needed for nausea.   folic acid (FOLVITE) [id number] MCG tablet Take 400 mcg by mouth daily.   melatonin 5 MG TABS Take 5 mg by mouth at bedtime as needed (sleep).   metoprolol succinate (TOPROL-XL) 50 MG 24 hr tablet Take 1 tablet (50 mg total) by mouth daily.   Multiple Vitamins-Minerals (PRESERVISION AREDS 2+MULTI VIT) CAPS Take 1 capsule by mouth 2 (two) times daily.   Omega-3 Fatty Acids (OMEGA-3 FISH OIL) 300 MG CAPS Take 1 capsule by mouth daily.   pyridOXINE (VITAMIN B-6) 100 MG tablet Take 100 mg by mouth daily.   spironolactone (ALDACTONE) 25 MG tablet Take 0.5 tablets (12.5 mg total) by mouth daily.   ticagrelor (BRILINTA) 90 MG TABS tablet Take 1 tablet (90 mg total) by mouth 2 (two) times daily.   triamcinolone cream (KENALOG) 0.1 % Apply 1 application topically 2 (two) times daily as needed (rash).   Vitamins-Lipotropics (LIPOFLAVONOID) TABS Take 2 tablets by mouth daily as needed (tinnitus).   Wheat Dextrin (BENEFIBER DRINK MIX PO) Take 2 Scoops by mouth daily.     Allergies  Allergen Reactions   Metoprolol Hives   Cantil [Mepenzolate] Hives   Diltiazem Nausea And Vomiting and Other (See Comments)    Dizziness/vertigo   Erythromycin Other (See Comments)    Stomach cramps   Versed [Midazolam] Other (See Comments)    Confusion/agitation x2  days after receiving versed.     Social History   Socioeconomic History   Marital status: Married    Spouse name: Not on file   Number of children: Not on file   Years of education: Not on file   Highest education level: Not on file  Occupational History   Not on file  Tobacco Use   Smoking status: Never   Smokeless tobacco: Not on file  Substance and Sexual Activity   Alcohol use: No   Drug use: No   Sexual activity: Not on file  Other Topics Concern   Not on file  Social History Narrative   Not on file   Social Determinants of Health   Financial Resource Strain: Not on file  Food  Insecurity: Not on file  Transportation Needs: Not on file  Physical Activity: Not on file  Stress: Not on file  Social Connections: Not on file  Intimate Partner Violence: Not on file     Review of Systems: General: negative for chills, fever, night sweats or weight changes.  Cardiovascular: negative for chest pain, dyspnea on exertion, edema, orthopnea, palpitations, paroxysmal nocturnal dyspnea or shortness of breath Dermatological: negative for rash Respiratory: negative for cough or wheezing Urologic: negative for hematuria Abdominal: negative for nausea, vomiting, diarrhea, bright red blood per rectum, melena, or hematemesis Neurologic: negative for visual changes, syncope, or dizziness All other systems reviewed and are otherwise negative except as noted above.    Blood pressure 124/76, height 5' (1.524 m), weight 106 lb 9.6 oz (48.4 kg).  General appearance: alert and no distress Neck: no adenopathy, no carotid bruit, no JVD, supple, symmetrical, trachea midline, and thyroid not enlarged, symmetric, no tenderness/mass/nodules Lungs: clear to auscultation bilaterally Heart: regular rate and rhythm, S1, S2 normal, no murmur, click, rub or gallop Extremities: extremities normal, atraumatic, no cyanosis or edema Pulses: 2+ and symmetric Skin: Skin color, texture, turgor normal. No rashes or lesions Neurologic: Grossly normal  EKG sinus rhythm at 66 with right bundle branch block.  I personally reviewed this EKG.  ASSESSMENT AND PLAN:   Hypertension History of essential pretension with blood pressure measured today in the office of 124/76.  She is on Toprol.  Hyperlipidemia History of hyperlipidemia on high-dose statin therapy.  We will recheck a lipid liver profile.  Ischemic cardiomyopathy History of ischemic cardiomyopathy with an EF by 2D echo 45 to 50% during her recent hospitalization.  She denies symptoms of heart failure.  She is on a beta-blocker as well as  spironolactone.  We will recheck a 2D echo in approxi-2 months.  Severe mitral regurgitation 2D echo at the time of her last hospitalization in December showed severe MR, TR and moderate to severe AI.     Lorretta Harp MD FACP,FACC,FAHA, [facility] 06/03/2021 4:36 PM

## 2021-06-03 NOTE — Assessment & Plan Note (Signed)
History of ischemic cardiomyopathy with an EF by 2D echo 45 to 50% during her recent hospitalization.  She denies symptoms of heart failure.  She is on a beta-blocker as well as spironolactone.  We will recheck a 2D echo in approxi-2 months.

## 2021-06-03 NOTE — Assessment & Plan Note (Signed)
History of essential pretension with blood pressure measured today in the office of 124/76.  She is on Toprol.

## 2021-06-21 ENCOUNTER — Telehealth (HOSPITAL_COMMUNITY): Payer: Self-pay | Admitting: Cardiovascular Disease

## 2021-06-21 NOTE — Telephone Encounter (Signed)
Patient cancelled echocardiogram for reason below: ? ?06/21/2021 7:43 AM RC:BULAG, KRYESTYN L  ?Cancel Rsn: Patient (Pt sends a letter stating that she would like to cancel her appointments) ? ?Order will be removed from the echo WQ. ?

## 2021-07-19 ENCOUNTER — Other Ambulatory Visit: Payer: Medicare Other

## 2023-03-22 IMAGING — CT CT CHEST W/ CM
2 of 4 series · 15 of 36 positions shown, 18 images · IV contrast (APPLIED)
Comparison: Chest x-ray 04/07/2021

CLINICAL DATA: Abnormal chest x-ray.

EXAM:
CT CHEST WITH CONTRAST
TECHNIQUE: Multidetector CT imaging of the chest was performed during
intravenous contrast administration.
CONTRAST:  60mL OMNIPAQUE IOHEXOL 350 MG/ML SOLN

[Series 3: chest w · axial · 0.57mm/px · z∈[-461,-205]mm · 12 of 152 slices shown, 15 images]
[im 12/152  mediastinal]
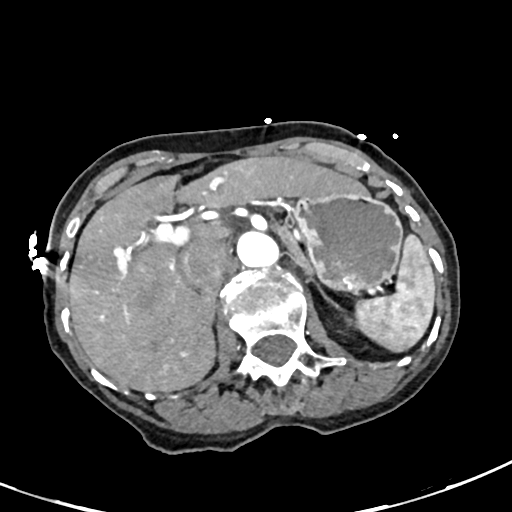
[im 12/152  lung]
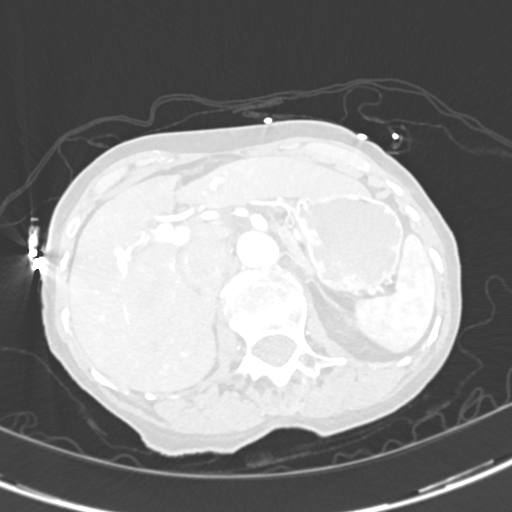
[im 24/152  lung]
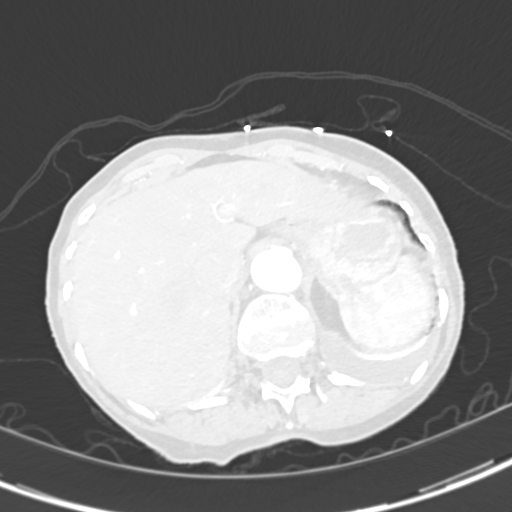
[im 35/152  lung]
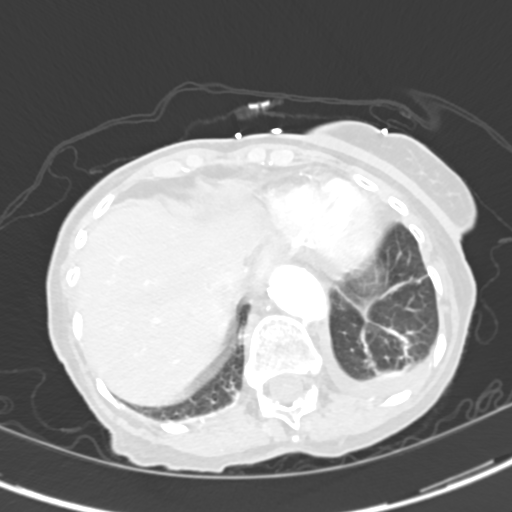
[im 47/152  lung]
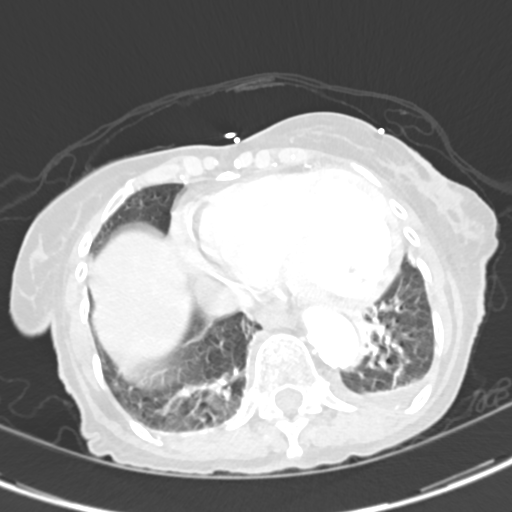
[im 59/152  mediastinal]
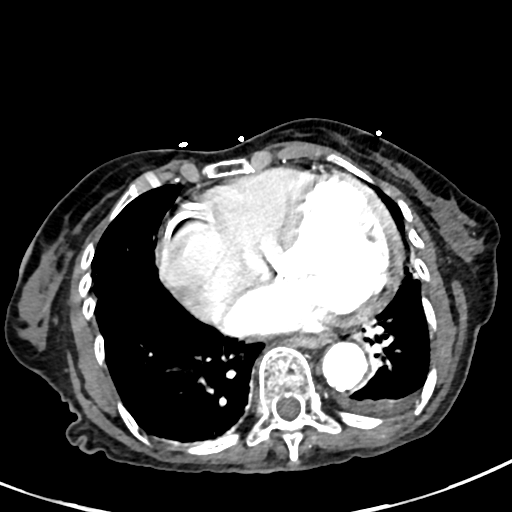
[im 59/152  lung]
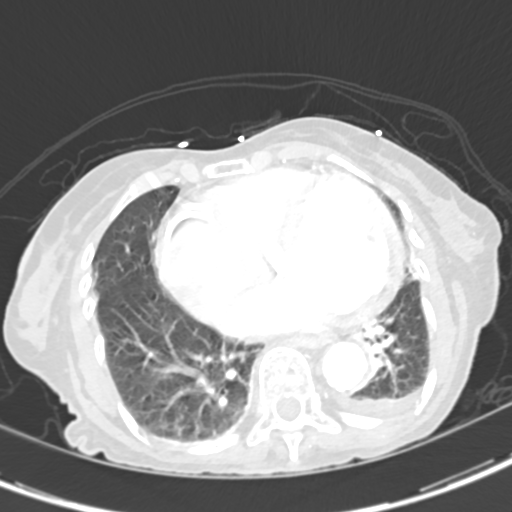
[im 70/152  lung]
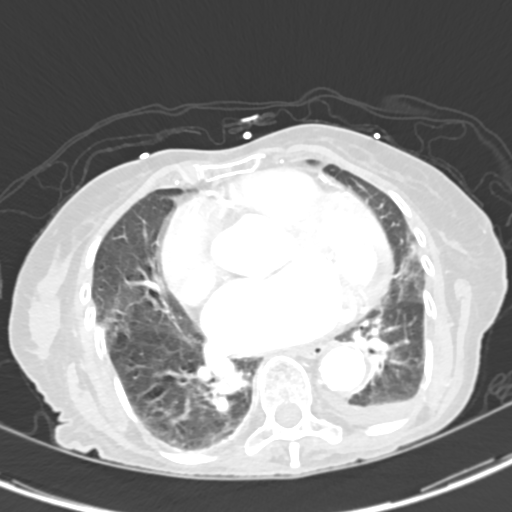
[im 82/152  lung]
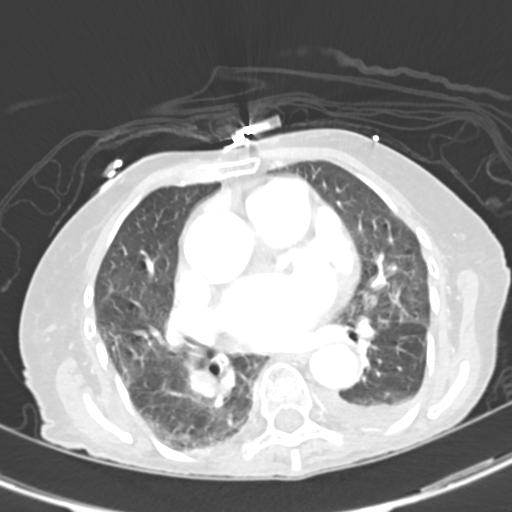
[im 93/152  lung]
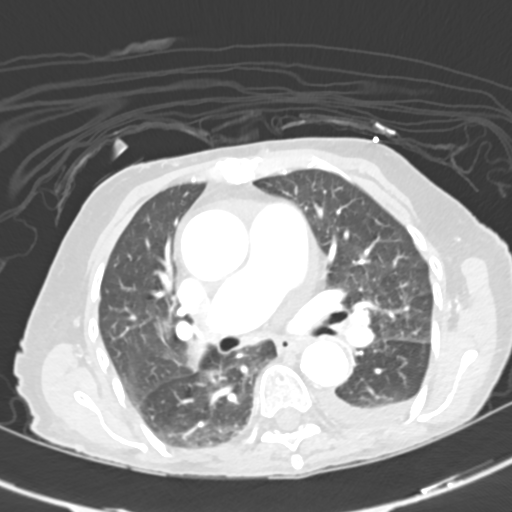
[im 105/152  mediastinal]
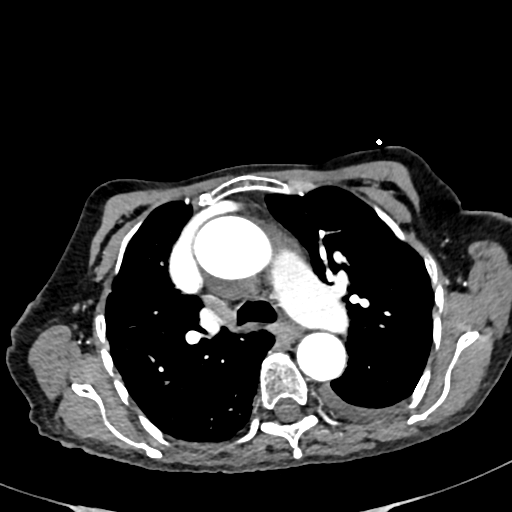
[im 105/152  lung]
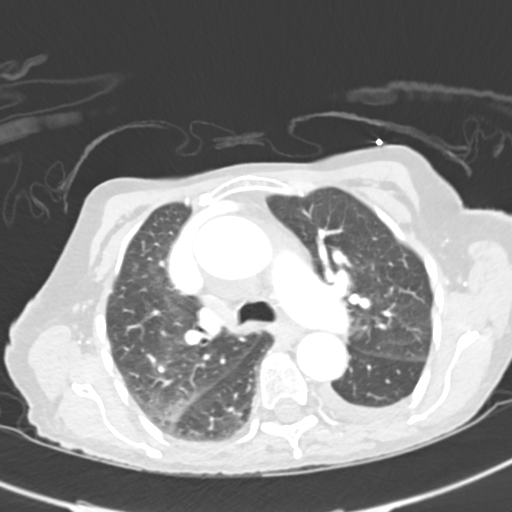
[im 117/152  lung]
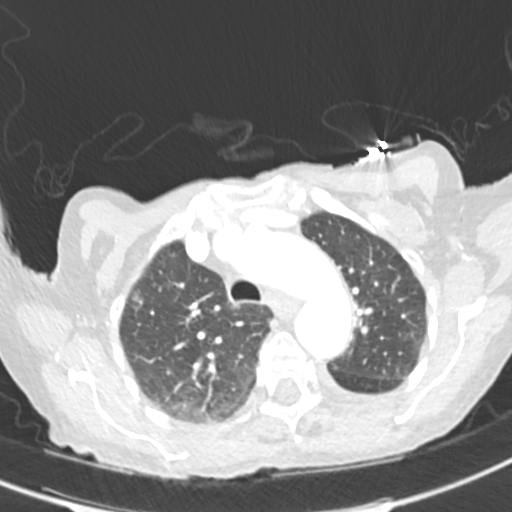
[im 128/152  lung]
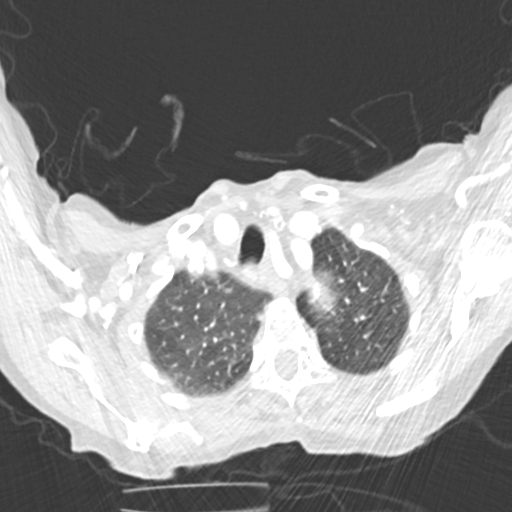
[im 140/152  lung]
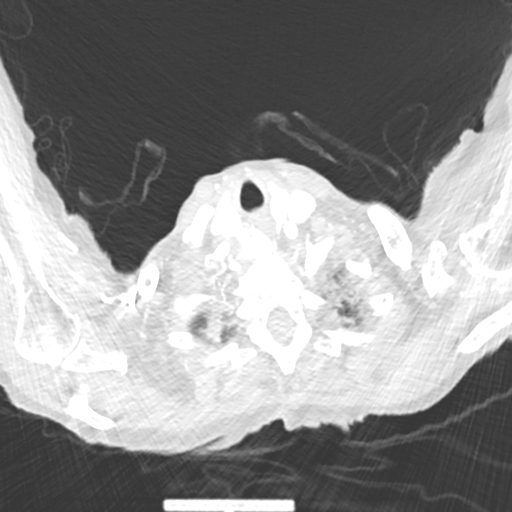

[Series 6: cor · coronal · 0.59mm/px · 3 of 115 slices shown]
[im 23/115  lung]
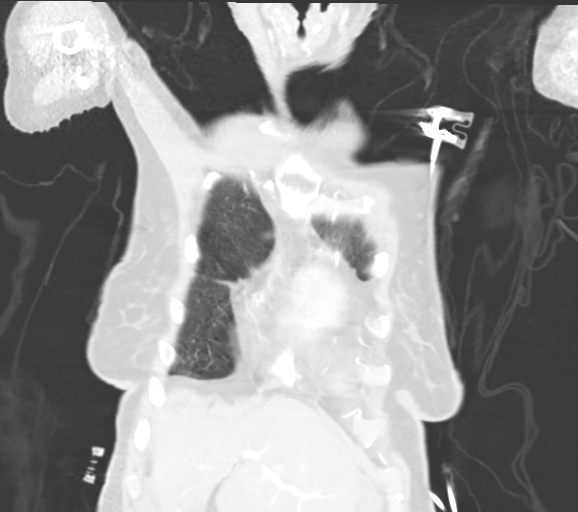
[im 46/115  lung]
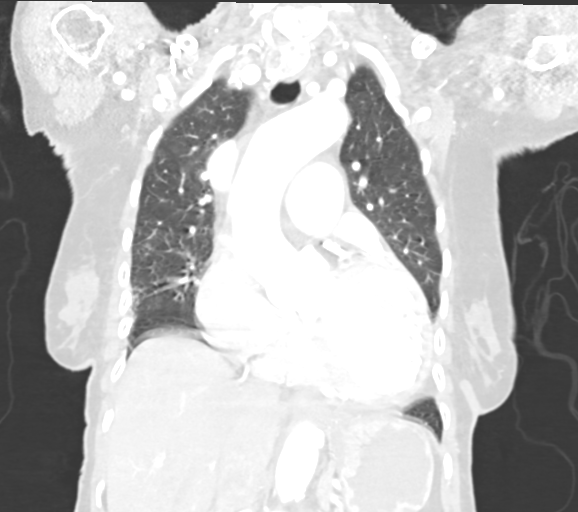
[im 69/115  lung]
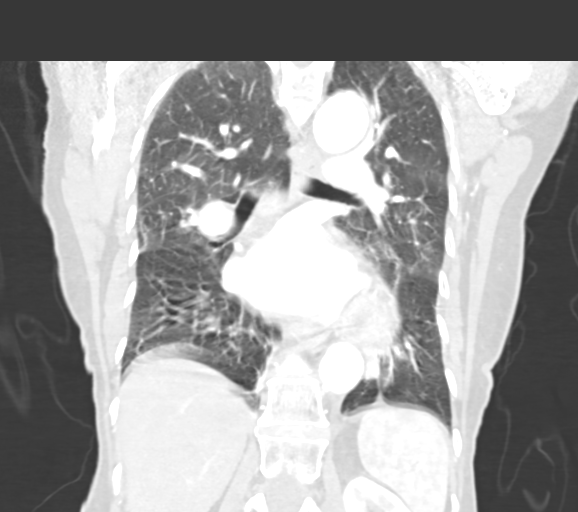

[15 of 36 positions shown; findings below may reference images not displayed]

FINDINGS: Cardiovascular: Cardiomegaly. Extensive coronary artery and
scattered aortic calcifications. Tortuous aorta. No evidence of
aneurysm. While the main pulmonary artery is not significantly
dilated, measuring 3.3 cm, the central right and left pulmonary
arteries are prominent suggesting pulmonary arterial hypertension.
The right perihilar density seen on prior chest x-ray likely related
to dilated right pulmonary artery.

Mediastinum/Nodes: No mediastinal, hilar, or axillary adenopathy.
Trachea and esophagus are unremarkable. Numerous nodules throughout
the right thyroid lobe measuring up to 1.7 cm.

Lungs/Pleura: Small left pleural effusion. Bibasilar atelectasis.
Biapical scarring. No suspicious pulmonary nodules in the right apex
or right hilar region as questioned on plain film.

Upper Abdomen: Imaging into the upper abdomen demonstrates no acute
findings.

Musculoskeletal: Chest wall soft tissues are unremarkable. No acute
bony abnormality.
IMPRESSION: Coronary artery disease.  Cardiomegaly.

Dilated central pulmonary arteries bilaterally compatible with
pulmonary arterial hypertension. This corresponds to the density in
the right hilar region.

Small left pleural effusion.  Bibasilar atelectasis.

No suspicious pulmonary nodules.

Numerous thyroid nodules measuring up to 1.7 cm. Recommend thyroid
US (ref: [HOSPITAL]. [DATE]): 143-50).

Aortic Atherosclerosis ([id number]).

## 2023-03-22 IMAGING — CT CT HEAD W/O CM
4 series · 17 of 47 positions shown, 19 images · non-contrast
Comparison: None.

CLINICAL DATA: Neuro deficit, acute, stroke suspected Mental status
change, unknown cause.

EXAM:
CT HEAD WITHOUT CONTRAST
TECHNIQUE: Contiguous axial images were obtained from the base of the skull
through the vertex without intravenous contrast.

[Series 2: head wo · axial · 0.46mm/px · z∈[-116,+8]mm · 7 of 35 slices shown, 9 images]
[im 5/35  brain]
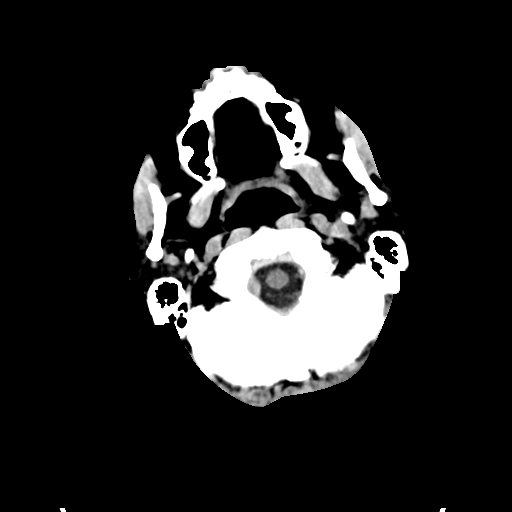
[im 5/35  bone]
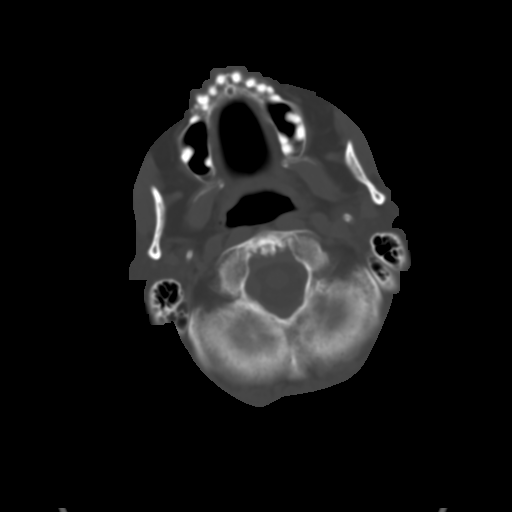
[im 9/35  brain]
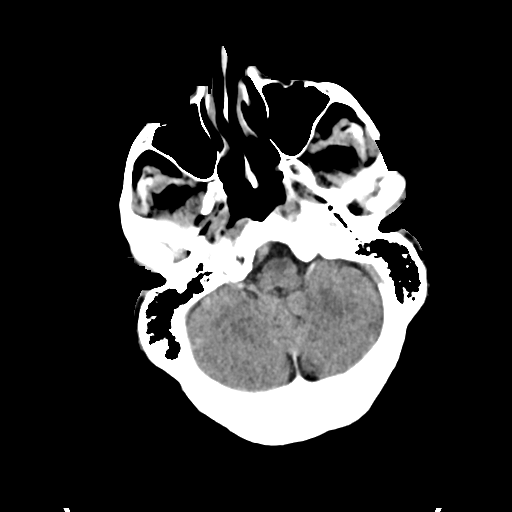
[im 13/35  brain]
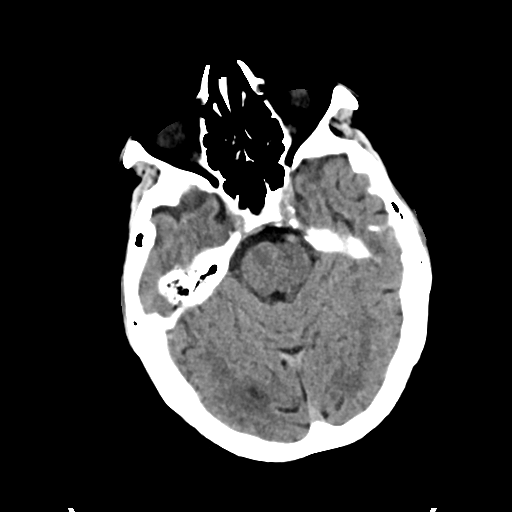
[im 18/35  brain]
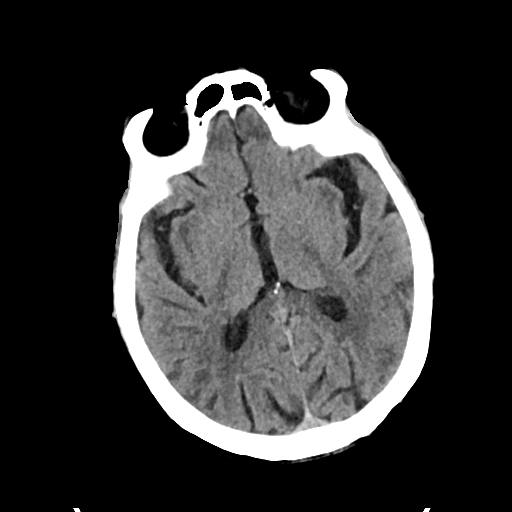
[im 22/35  brain]
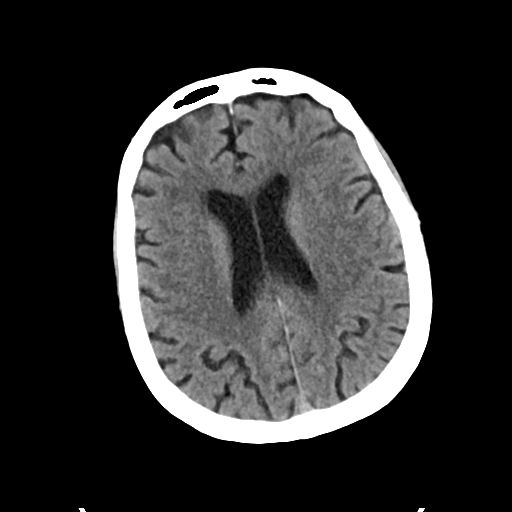
[im 22/35  bone]
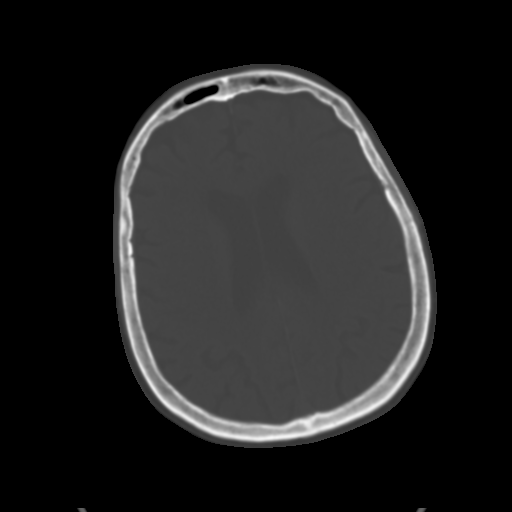
[im 26/35  brain]
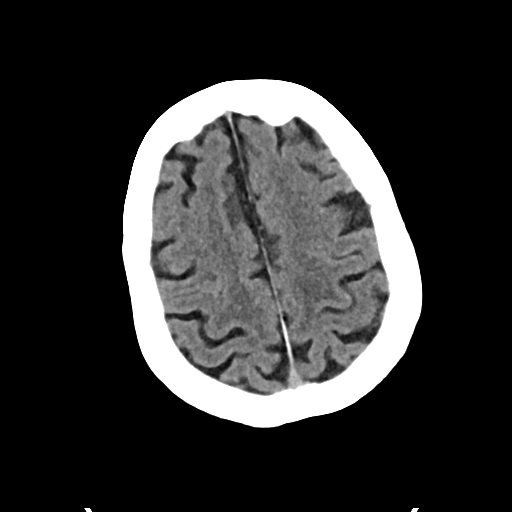
[im 30/35  brain]
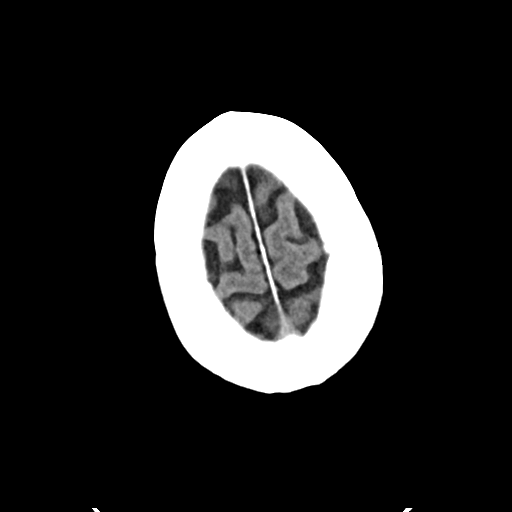

[Series 3: head bone · axial · 0.46mm/px · z∈[-120,-58]mm · 4 of 88 slices shown]
[im 9/88  bone]
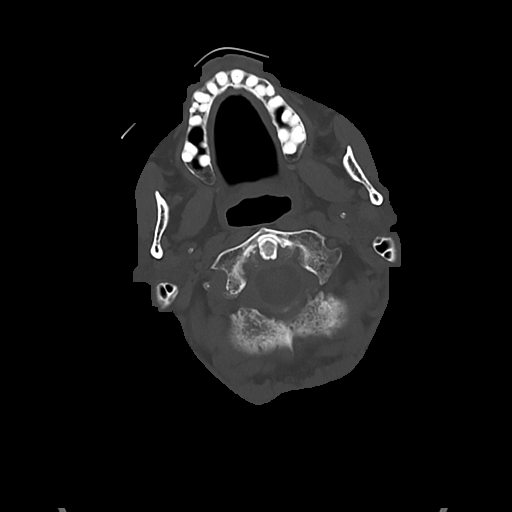
[im 18/88  bone]
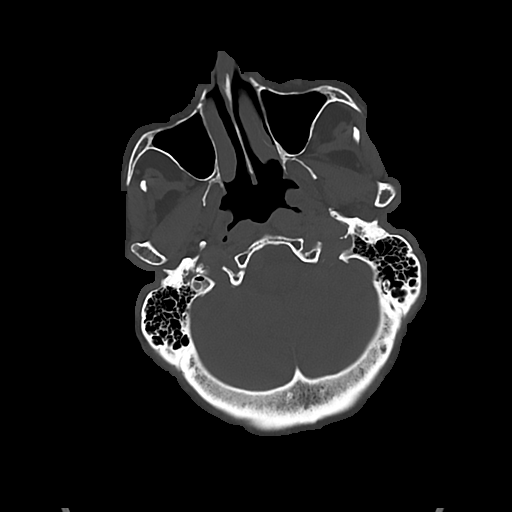
[im 27/88  bone]
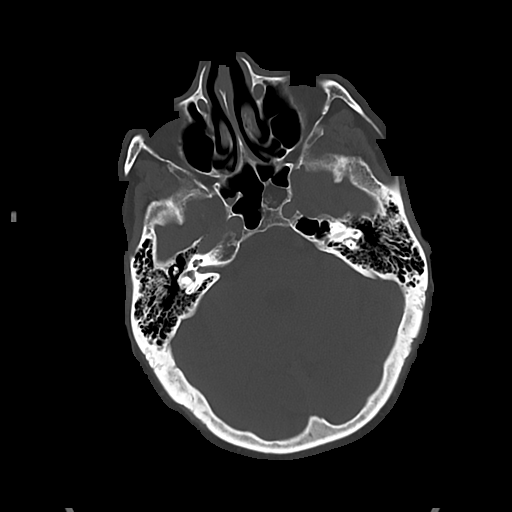
[im 40/88  bone]
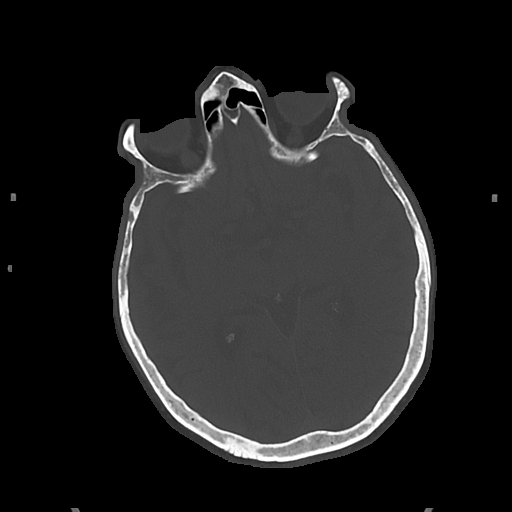

[Series 4: cor soft · coronal · 0.37mm/px · 3 of 70 slices shown]
[im 24/70  brain]
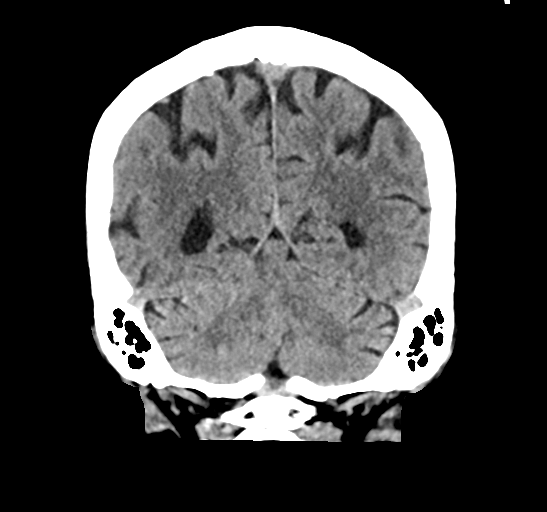
[im 31/70  brain]
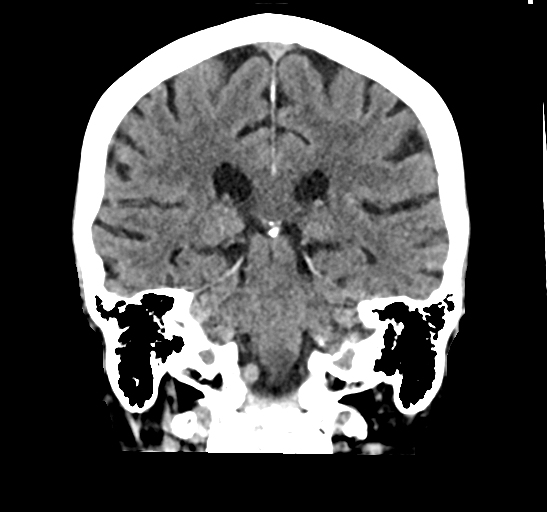
[im 39/70  brain]
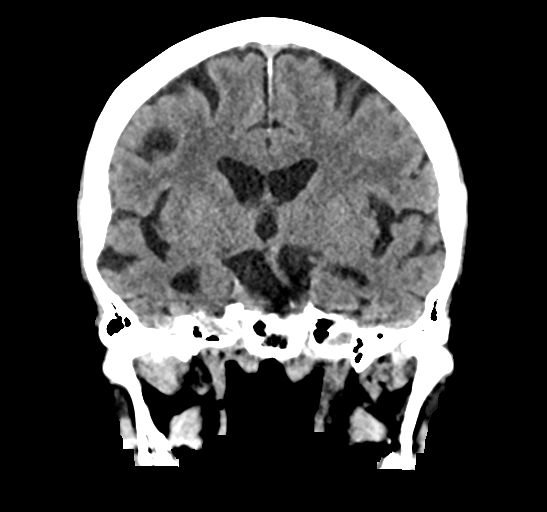

[Series 5: sag soft · sagittal · 0.37mm/px · 3 of 60 slices shown]
[im 20/60  brain]
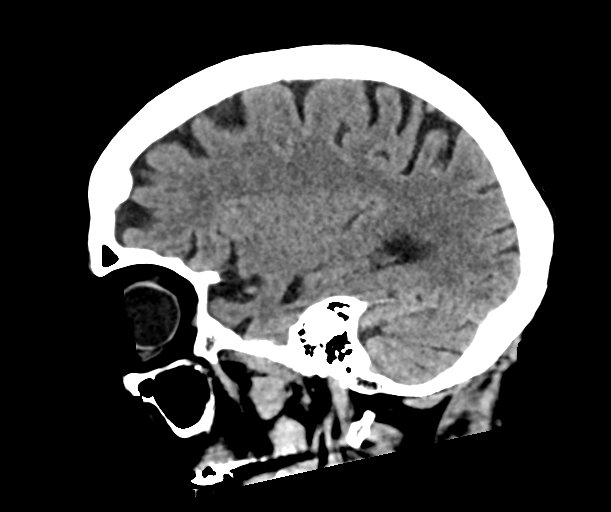
[im 30/60  brain]
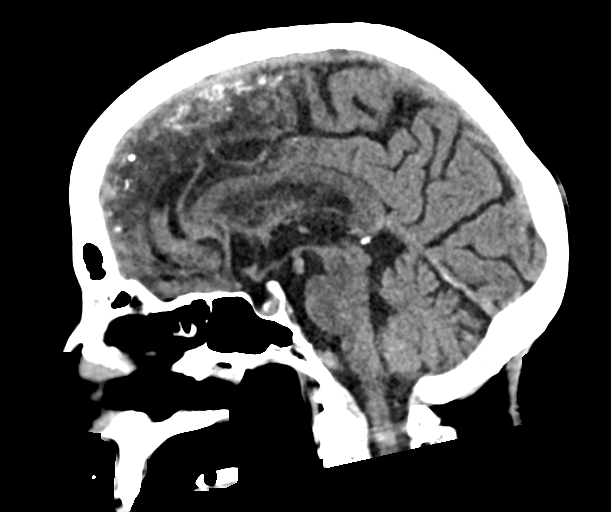
[im 40/60  brain]
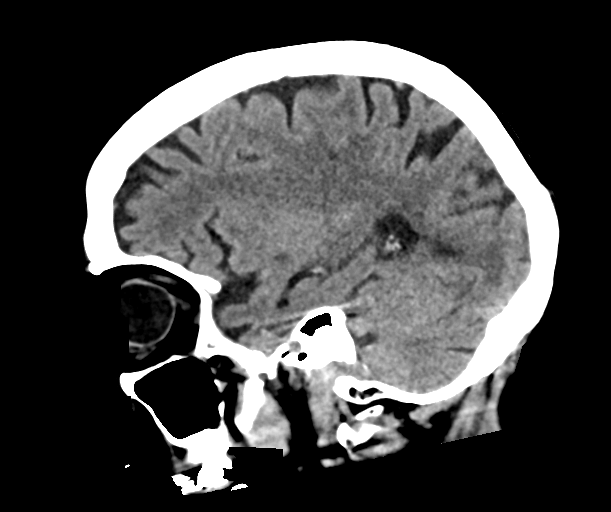

[17 of 47 positions shown; findings below may reference images not displayed]

FINDINGS: Brain: There is atrophy and chronic small vessel disease changes. No
acute intracranial abnormality. Specifically, no hemorrhage,
hydrocephalus, mass lesion, acute infarction, or significant
intracranial injury.

Vascular: No hyperdense vessel or unexpected calcification.

Skull: No acute calvarial abnormality.

Sinuses/Orbits: No acute findings

Other: None
IMPRESSION: Atrophy, chronic microvascular disease.

No acute intracranial abnormality.
